# Patient Record
Sex: Female | Born: 1955 | Race: Black or African American | Marital: Married | Smoking: Never smoker
Health system: Southern US, Community
[De-identification: ages and names within clinical notes are randomized; demographics above are authoritative.]

## PROBLEM LIST (undated history)

## (undated) DIAGNOSIS — M545 Low back pain, unspecified: Secondary | ICD-10-CM

## (undated) DIAGNOSIS — M542 Cervicalgia: Secondary | ICD-10-CM

## (undated) HISTORY — DX: Low back pain, unspecified: M54.50

## (undated) HISTORY — DX: Cervicalgia: M54.2

---

## 2004-04-07 ENCOUNTER — Emergency Department
Admit: 2004-04-07 | Discharge: 2004-04-07 | Disposition: A | Discharge status: Home / Self Care | Payer: Self-pay | Source: Emergency Department | Admitting: Internal Medicine

## 2011-08-30 ENCOUNTER — Ambulatory Visit
Admission: RE | Admit: 2011-08-30 | Disposition: A | Discharge status: Home / Self Care | Payer: Self-pay | Source: Ambulatory Visit | Attending: Gastroenterology | Admitting: Gastroenterology

## 2011-08-30 ENCOUNTER — Ambulatory Visit: Payer: Self-pay
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
## 2011-09-24 ENCOUNTER — Inpatient Hospital Stay
Admission: RE | Admit: 2011-09-24 | Disposition: A | Discharge status: Home / Self Care | Payer: Self-pay | Source: Ambulatory Visit | Attending: Surgery | Admitting: Surgery

## 2011-09-24 LAB — TYPE AND SCREEN
AB Screen Gel: NEGATIVE
ABO Rh: O POS

## 2011-09-28 ENCOUNTER — Observation Stay
Admission: EM | Admit: 2011-09-28 | Disposition: A | Discharge status: Home / Self Care | Payer: Self-pay | Source: Emergency Department | Admitting: Internal Medicine

## 2011-09-28 LAB — CBC AND DIFFERENTIAL
Basophils Absolute Automated: 0.02 10*3/uL (ref 0.00–0.20)
Basophils Automated: 0 % (ref 0–2)
Eosinophils Absolute Automated: 0.17 10*3/uL (ref 0.00–0.70)
Eosinophils Automated: 2 % (ref 0–5)
Hematocrit: 34.3 % — ABNORMAL LOW (ref 37.0–47.0)
Hgb: 11.5 g/dL — ABNORMAL LOW (ref 12.0–16.0)
Immature Granulocytes Absolute: 0.05 10*3/uL
Immature Granulocytes: 0 % (ref 0–1)
Lymphocytes Absolute Automated: 4.86 10*3/uL — ABNORMAL HIGH (ref 0.50–4.40)
Lymphocytes Automated: 42 % — ABNORMAL HIGH (ref 15–41)
MCH: 28.9 pg (ref 28.0–32.0)
MCHC: 33.5 g/dL (ref 32.0–36.0)
MCV: 86.2 fL (ref 80.0–100.0)
MPV: 9.2 fL — ABNORMAL LOW (ref 9.4–12.3)
Monocytes Absolute Automated: 1.04 10*3/uL (ref 0.00–1.20)
Monocytes: 9 % (ref 0–11)
Neutrophils Absolute: 5.45 10*3/uL (ref 1.80–8.10)
Neutrophils: 47 % — ABNORMAL LOW (ref 52–75)
Nucleated RBC: 0 /100 WBC
Platelets: 301 10*3/uL (ref 140–400)
RBC: 3.98 10*6/uL — ABNORMAL LOW (ref 4.20–5.40)
RDW: 14 % (ref 12–15)
WBC: 11.54 10*3/uL — ABNORMAL HIGH (ref 3.50–10.80)

## 2011-09-28 LAB — HEPATIC FUNCTION PANEL
ALT: 40 U/L (ref 7–56)
AST (SGOT): 34 U/L (ref 5–40)
Albumin/Globulin Ratio: 1.2 (ref 1.1–1.8)
Albumin: 3.8 g/dL (ref 3.7–5.1)
Alkaline Phosphatase: 73 U/L (ref 43–122)
Bilirubin Direct: 0.4 mg/dL — ABNORMAL HIGH (ref 0.0–0.3)
Bilirubin Indirect: 0.2 mg/dL (ref 0.0–1.1)
Bilirubin, Total: 0.5 mg/dL (ref 0.2–1.3)
Globulin: 3.2 g/dL (ref 2.0–3.7)
Protein, Total: 7 g/dL (ref 6.0–8.0)

## 2011-09-28 LAB — URINALYSIS, REFLEX TO MICROSCOPIC EXAM IF INDICATED
Bilirubin, UA: NEGATIVE
Blood, UA: NEGATIVE
Glucose, UA: NEGATIVE
Ketones UA: 40 — AB
Nitrite, UA: NEGATIVE
Specific Gravity UA POCT: 1.024 (ref 1.001–1.035)
Urine pH: 5 (ref 5.0–8.0)
Urobilinogen, UA: NORMAL mg/dL

## 2011-09-28 LAB — BASIC METABOLIC PANEL
Anion Gap: 15 (ref 5.0–15.0)
BUN: 35 mg/dL — ABNORMAL HIGH (ref 7–21)
CO2: 25 mEq/L (ref 22–31)
Calcium: 9.3 mg/dL (ref 8.6–10.2)
Chloride: 99 mEq/L (ref 98–107)
Creatinine: 1 mg/dL (ref 0.5–1.4)
Glucose: 90 mg/dL (ref 70–100)
Potassium: 3.8 mEq/L (ref 3.6–5.0)
Sodium: 139 mEq/L (ref 136–143)

## 2011-09-28 LAB — LIPASE: Lipase: 110 U/L (ref 23–300)

## 2011-09-28 LAB — GFR: EGFR: >60

## 2011-09-28 LAB — CK: Creatine Kinase (CK): 69 U/L (ref 0–140)

## 2011-09-28 LAB — TROPONIN I: Troponin I: 0.04 ng/mL (ref 0.00–0.11)

## 2011-09-29 LAB — ECG 12-LEAD
Atrial Rate: 102 {beats}/min
P Axis: 83 degrees
P-R Interval: 140 ms
Q-T Interval: 404 ms
QRS Duration: 84 ms
QTC Calculation (Bezet): 526 ms
R Axis: 44 degrees
T Axis: 46 degrees
Ventricular Rate: 102 {beats}/min

## 2011-09-29 NOTE — H&P (Signed)
Paula Trujillo, Paula Trujillo                                              MRN:          [id number]                                                          Account:      [id number]                                         Document ID:  [id number]                                                                                                                                        Admit Date: 09/28/2011     Patient Location: K420-01  Patient Type: I     ATTENDING PHYSICIAN: Carroll Sage, MD        CHIEF COMPLAINT:  Dizziness, generalized weakness.     HISTORY OF PRESENT ILLNESS:  A 55 year old female with obesity, had a gastric bypass surgery done just 4  days ago, discharged home on Thursday, felt very dizzy, lightheaded,  generalized weakness since yesterday.  Admitted in the hospital in [facility] last night with acute dehydration, dizziness and with  orthostatic hypotension.     REVIEW OF SYSTEMS:  No fever, no chills, no chest pain, no shortness of breath, no  palpitations, no nausea, no vomiting, no abdominal pain with status post  gastric bypass surgery.  Dizziness present.  Generalized weakness present.     PAST MEDICAL HISTORY:  History of asthma.     PAST SURGICAL HISTORY:  C-section, gastric bypass, hysterectomy.     ALLERGIES:  No allergies.     MEDICATIONS:  Daily multivitamins, calcium, vitamin B12, iron tablets.     FAMILY HISTORY:  Not significant.     SOCIAL HISTORY:  Lives with the family.     PHYSICAL EXAMINATION:  GENERAL:  The patient is comfortable in the bed.  VITAL SIGNS:  Blood pressure is 123/58, pulse is 95, respiratory rate 18,  temperature 96.8.  Page 1 of 2  Paula Trujillo, Paula Trujillo                                              MRN:          [id number]                                                          Account:      [id number]                                         Document ID:   [id number]                                                                                                                                        GENERAL:  Not in acute distress.  HEENT:  PERRLA. Extraocular movements normal.  NECK:  Supple.  CHEST:  Clear.  CARDIAC:  S1, S2 present.  ABDOMEN:  Dressing present.  EXTREMITIES:  No pedal edema.  NEUROLOGIC:  Alert, awake, oriented.  Motor strength intact.  SKIN:  No bruises.  HEMATOLOGIC:  No bleeding.  PSYCHIATRIC:  Mood stable.     LABORATORY AND DIAGNOSTIC DATA:  WBC 11.54, hemoglobin is 11.5, hematocrit is 34.3, platelets are 301.  Sodium is 139, potassium is 3.8, chloride is 99, bicarbonate is 25, BUN is  35, creatinine is 1, glucose is 90, calcium is 9.3, total protein is 7,  albumin is 3.8, globulin is 3.2, total bilirubin is 0.5, AST is 34, ALT is  40, alkaline phosphatase is 73.  CK is 69.  Lipase is 110.  Troponin is  0.04.  UA:  Leukocyte esterase is moderate.  CT of the chest is normal  postoperative appearance following a recent gastric bypass surgery, no  evidence of pulmonary embolism.  CT abdomen and pelvis:  Normal  postoperative appearance following recent gastric bypass surgery, no CT  evidence of detectable pulmonary embolism.     ASSESSMENT AND PLAN:  1.  Acute dehydration.  2.  Orthostatic hypotension.  3.  Dizziness.     PLAN:  Admitted in the telemetry.  IV fluids, normal saline at 75 mL per hour,  will increase to 125 cc per hour.  We will monitor and if no orthostatic  hypertension, will plan to discharge home today, and the patient needs to  follow up with the gastric bypass surgeon as an outpatient.           Electronic  Signing Provider     D:  09/29/2011 11:25 AM by Dr. Marla Roe. Erlene Senters, MD ([id number])  T:  09/29/2011 13:12 PM by [id number]        cc:                                                                                                           Page 2 of 2  Authenticated by Celso Sickle, MD ([id number]) On  09/29/2011 01:28:45 PM

## 2015-12-08 ENCOUNTER — Other Ambulatory Visit: Payer: Self-pay

## 2016-02-14 ENCOUNTER — Ambulatory Visit: Payer: Self-pay

## 2016-02-16 ENCOUNTER — Encounter (INDEPENDENT_AMBULATORY_CARE_PROVIDER_SITE_OTHER): Payer: Self-pay | Admitting: Neurological Surgery

## 2016-02-23 NOTE — Progress Notes (Signed)
02/14/16 0900   Pain History   Pain Symptoms Pain  (Neck/LBP)   Foot Drop No  (Limping on right)   Incontinence No   Treatments   Have you visited a chiropractor for this problem? No   Have you ever had physical therapy for this problem? No   Have you ever received an injection for this problem? Yes   If so, when? Within the last year   List MD who gave injection: (Dr. Lillia Abed, [facility])   Diagnostic Tests   MRI Cervical;Thoracic;Lumbar   Care Management   How did you hear about our program Internet Search   Have you had any prior spine surgeries? No   ISI Appointment   ISI Physician Rosemarie Beath   ISI Appointment Date 03/05/16  (9:00 AM)   ISI Appointment Location [facility]

## 2016-03-05 ENCOUNTER — Encounter (INDEPENDENT_AMBULATORY_CARE_PROVIDER_SITE_OTHER): Payer: Self-pay | Admitting: Neurological Surgery

## 2016-03-05 ENCOUNTER — Ambulatory Visit (INDEPENDENT_AMBULATORY_CARE_PROVIDER_SITE_OTHER): Payer: BLUE CROSS/BLUE SHIELD | Admitting: Neurological Surgery

## 2016-03-05 ENCOUNTER — Other Ambulatory Visit: Payer: Self-pay

## 2016-03-05 ENCOUNTER — Inpatient Hospital Stay
Admission: RE | Admit: 2016-03-05 | Discharge: 2016-03-05 | Disposition: A | Discharge status: Home / Self Care | Payer: Self-pay | Source: Ambulatory Visit

## 2016-03-05 VITALS — BP 142/97 | HR 97 | Resp 14 | Ht 67.0 in | Wt 139.0 lb

## 2016-03-05 DIAGNOSIS — M5416 Radiculopathy, lumbar region: Secondary | ICD-10-CM

## 2016-03-05 DIAGNOSIS — M5136 Other intervertebral disc degeneration, lumbar region: Secondary | ICD-10-CM

## 2016-03-05 DIAGNOSIS — M25551 Pain in right hip: Secondary | ICD-10-CM | POA: Insufficient documentation

## 2016-03-05 DIAGNOSIS — M5440 Lumbago with sciatica, unspecified side: Secondary | ICD-10-CM

## 2016-03-05 DIAGNOSIS — G8929 Other chronic pain: Secondary | ICD-10-CM

## 2016-03-05 NOTE — Progress Notes (Signed)
Review of Systems   Constitutional: Positive for diaphoresis, activity change and fatigue.   HENT: Positive for dental problem.    Eyes: Negative.    Respiratory: Positive for shortness of breath and wheezing.    Cardiovascular: Negative.    Gastrointestinal: Positive for nausea and abdominal pain.   Endocrine: Negative.    Genitourinary: Positive for flank pain.   Musculoskeletal: Positive for back pain, gait problem, neck pain and neck stiffness.   Skin: Negative.    Allergic/Immunologic: Negative.    Neurological: Positive for dizziness, tremors, weakness and numbness.   Hematological: Bruises/bleeds easily.   Psychiatric/Behavioral: Positive for decreased concentration and agitation.

## 2016-03-05 NOTE — Progress Notes (Addendum)
[facility]  New Patient Note    Referring MD:    Primary Care MD: No primary care provider on file.    MRN: [id number]    HPI     Chief Complaint   Patient presents with   . Initial Consult     HPI    Patient ID: Paula Trujillo is a 60 y.o.  female, DOB 31-Dec-1955 is here today for evaluation of RLE pain. Patient has h/o scoliosis and reports chronic back pain, however, she has had worsening of sx since February 2017.  She was evaluated in Feb 2017 at ED where she had L-spine MRI done and was told that the pain was coming from her spine and not her R hip. She has not had formal evaluation with ortho for hip. Pain is through R hip and radiates to groin, anterior thigh, lateral leg. She does not think the pain goes to the top of foot. There is associated numbness and tingling. She notes constant pain but she also gets "spikes" that escalate her pain level to 8/10. There are no specific inciting factors as to when she gets these "spikes". She states that the pain is debilitating and "pasralyzes her R leg" when it happens. She is concerned about falling and embarrassed by these episodes. She had 1 session of PT but did not return because she did not like the provider. She has had ESI x 4 (3 lower back and one upper lumbar) by [facility]. She notes most improvement with the upper spine injection. She is anxious for a solution as she states it is affecting her QOL. There is no BB dysfunction.     Medical History     Past Medical History   Diagnosis Date   . Lower back pain    . Neck pain        Surgical History   No past surgical history on file.    Family History   No family history on file.     Social History     Social History     Social History   . Marital Status: Married     Spouse Name: N/A   . Number of Children: N/A   . Years of Education: N/A     Occupational History   . Not on file.     Social History Main Topics   . Smoking status: Never Smoker    . Smokeless tobacco: Not on file   .  Alcohol Use: No   . Drug Use: No   . Sexual Activity: Not on file     Other Topics Concern   . Not on file     Social History Narrative   [employer]: Field seismologist  Current Medications     Current Outpatient Prescriptions   Medication Sig Dispense Refill   . albuterol (PROVENTIL HFA;VENTOLIN HFA) 108 (90 Base) MCG/ACT inhaler Inhale 2 puffs into the lungs.     . Estrogens Conjugated (PREMARIN PO) Take by mouth.     . Olmesartan Medoxomil (BENICAR PO) Take by mouth.     . OXYCODONE HCL PO Take by mouth.       No current facility-administered medications for this visit.       Allergies   No Known Allergies    Review of Systems   Review of Systems  Constitutional: Positive for diaphoresis, activity change and fatigue.   HENT: Positive for dental problem.    Eyes: Negative.  Respiratory: Positive for shortness of breath and wheezing.    Cardiovascular: Negative.    Gastrointestinal: Positive for nausea and abdominal pain.   Endocrine: Negative.    Genitourinary: Positive for flank pain.   Musculoskeletal: Positive for back pain, gait problem, neck pain and neck stiffness.   Skin: Negative.    Allergic/Immunologic: Negative.    Neurological: Positive for dizziness, tremors, weakness and numbness.   Hematological: Bruises/bleeds easily.   Psychiatric/Behavioral: Positive for decreased concentration and agitation.      Physical Examination   VITAL SIGNS:   height is 1.702 m (5\' 7" ) and weight is 63.05 kg (139 lb). Her blood pressure is 142/97 and her pulse is 97. Her respiration is 14.          General:  Well developed, well nourished, no apparent distress  Neck:  Supple, no JVD, no apparent lymphadenopathy  HEENT:  Head normocephalic, atraumatic, no discharge from ears or nose  Chest:  Equal chest rise.  No wheezes, rales or rhonchi.  Skin:  No obvious lesions or scars  Extremities:  Without clubbing or cyanosis    Neurologic Exam     Mental Status   Oriented to person, place, and time.   Speech: speech is normal      Level of consciousness: alert    Cranial Nerves     CN II   Visual fields full to confrontation.     CN III, IV, VI   Pupils are equal, round, and reactive to light.  Extraocular motions are normal.     CN V   Facial sensation intact.     CN VII   Facial expression full, symmetric.     CN VIII   CN VIII normal.     CN IX, X   CN IX normal.     CN XI   CN XI normal.     CN XII   CN XII normal.     Motor Exam   Muscle bulk: normal  Overall muscle tone: normal    Strength   Strength 5/5 except as noted.   Right strength: R IP 4+/5  Right neck flexion: 5/5  Left neck flexion: 5/5  Right neck extension: 5/5  Left neck extension: 5/5  Right deltoid: 5/5  Left deltoid: 5/5  Right biceps: 5/5  Left biceps: 5/5  Right triceps: 5/5  Left triceps: 5/5  Right wrist flexion: 5/5  Left wrist flexion: 5/5  Right wrist extension: 5/5  Left wrist extension: 5/5  Right interossei: 5/5  Left interossei: 5/5  Right abdominals: 5/5  Left abdominals: 5/5  Right iliopsoas: 5/5  Left iliopsoas: 5/5  Right quadriceps: 5/5  Left quadriceps: 5/5  Right hamstring: 5/5  Left hamstring: 5/5  Right glutei: 5/5  Left glutei: 5/5  Right anterior tibial: 5/5  Left anterior tibial: 5/5  Right posterior tibial: 5/5  Left posterior tibial: 5/5  Right peroneal: 5/5  Left peroneal: 5/5  Right gastroc: 5/5  Left gastroc: 5/5       -SLR  ADD/Abd 5/5  No pain with PROM R hip     Sensory Exam   Light touch normal.   Pinprick normal.     Gait, Coordination, and Reflexes     Gait  Gait: normal    Reflexes   Right brachioradialis: 2+  Left brachioradialis: 2+  Right biceps: 2+  Left biceps: 2+  Right patellar: 2+  Left patellar: 2+  Right achilles: 2+  Left achilles: 2+  Radiology Interpretation   Mri Lumbar Spine 12/08/15; comparison to 01/31/2009    L1-2: small disc bulge with no stenosis or root encroachment. There is disc space narrowing and dessication  L2-3: No significant stenosis or root encroachment.  L3-4: No significant stenosis or root  encroachment.  L4-5: disc dessication; previously seen disc protrusion and stenosis are less than on prior exam. Lateral disc protrusions may be touching exiting L4 nerve roots  L5-S1: There is conjoined root at the S1 level on the left. No significant disc herniation or stenosis    Impression   33 y F, Lumbar  DDD, levoscoliosis. R hip and leg pain that seem to favor L5 distribution [secondary to L4/5 disc protrusion] as opposed to L1-2 however no significant nerve compression on MRI and overall MRI appears improved from prior study. We need to rule out R hip pathology and will refer to Dr. Lewie Chamber for evaluation. We will also refer to Dr. Roselyn Meier for L1-2 R facet injection and advised patient to keep diary of post-procedure pain levels. We will also send for PT and patient was encouraged to do the recommended sessions. We will see her back in 4 weeks to monitor progress.   If pain continues to persist at folow-up, we will likely proceed with discogram.   Plan     Orders Placed This Encounter   Procedures   . Referral to Physical Therapy     Referral Priority:  Routine     Referral Type:  Physical Medicine     Referral Reason:  Specialty Services Required     Requested Specialty:  Physical Therapy     Number of Visits Requested:  1   . Ambulatory referral to Orthopedic Surgery     Referral Priority:  Routine     Referral Type:  Surgical     Referral Reason:  Specialty Services Required     Requested Specialty:  Orthopaedic Surgery     Number of Visits Requested:  1   . Referral to Pain Clinic     Referral Priority:  Routine     Referral Type:  Consultation     Referral Reason:  Specialty Services Required     Requested Specialty:  Pain Medicine     Number of Visits Requested:  1   RX: TENS unit  Follow-up   4 weeks    Joy A. Rostron, PA-C   Marlaine Hind, MD

## 2016-03-06 ENCOUNTER — Ambulatory Visit: Payer: Self-pay

## 2016-03-06 DIAGNOSIS — G8929 Other chronic pain: Secondary | ICD-10-CM | POA: Insufficient documentation

## 2016-03-15 ENCOUNTER — Encounter (INDEPENDENT_AMBULATORY_CARE_PROVIDER_SITE_OTHER): Payer: Self-pay | Admitting: Orthopaedic Surgery

## 2016-03-15 ENCOUNTER — Ambulatory Visit (INDEPENDENT_AMBULATORY_CARE_PROVIDER_SITE_OTHER): Payer: BLUE CROSS/BLUE SHIELD

## 2016-03-15 ENCOUNTER — Ambulatory Visit (INDEPENDENT_AMBULATORY_CARE_PROVIDER_SITE_OTHER): Payer: BLUE CROSS/BLUE SHIELD | Admitting: Orthopaedic Surgery

## 2016-03-15 VITALS — BP 120/75 | HR 83 | Temp 98.2°F | Ht 66.38 in | Wt 139.0 lb

## 2016-03-15 DIAGNOSIS — M7061 Trochanteric bursitis, right hip: Secondary | ICD-10-CM

## 2016-03-15 DIAGNOSIS — M5416 Radiculopathy, lumbar region: Secondary | ICD-10-CM

## 2016-03-15 DIAGNOSIS — M5136 Other intervertebral disc degeneration, lumbar region: Secondary | ICD-10-CM

## 2016-03-15 DIAGNOSIS — M25551 Pain in right hip: Secondary | ICD-10-CM

## 2016-03-15 MED ORDER — TRIAMCINOLONE ACETONIDE 40 MG/ML IJ SUSP
40.0000 mg | Freq: Once | INTRAMUSCULAR | Status: AC
Start: 2016-03-15 — End: 2016-03-15
  Administered 2016-03-15: 40 mg via INTRA_ARTICULAR

## 2016-03-15 NOTE — Patient Instructions (Signed)
Ice for hip pain or swelling  OTCs for pain  PT as directed.   Hip exercises daily  Low impact activities only  Ideal body weight  F/U PRN

## 2016-03-15 NOTE — Progress Notes (Signed)
Paula Trujillo is a 60 y.o. female with the following history as recorded in EpicCare:  Patient Active Problem List    Diagnosis Date Noted   . Trochanteric bursitis, right hip 03/15/2016   . Lumbar radiculopathy 03/06/2016   . Chronic midline low back pain with sciatica 03/06/2016   . DDD (degenerative disc disease), lumbar 03/05/2016   . Right hip pain 03/05/2016     Current Outpatient Prescriptions   Medication Sig Dispense Refill   . albuterol (PROVENTIL HFA;VENTOLIN HFA) 108 (90 Base) MCG/ACT inhaler Inhale 2 puffs into the lungs.     Marland Kitchen albuterol-ipratropium (COMBIVENT RESPIMAT) 20-100 MCG/ACT Aero Soln      . celecoxib (CELEBREX) 200 MG capsule      . Estrogens Conjugated (PREMARIN PO) Take by mouth.     . olmesartan-hydrochlorothiazide (BENICAR HCT) 20-12.5 MG per tablet      . oxyCODONE 10 MG Tab        No current facility-administered medications for this visit.     Allergies: Review of patient's allergies indicates no known allergies.  Past Medical History   Diagnosis Date   . Lower back pain    . Neck pain      History reviewed. No pertinent past surgical history.  History reviewed. No pertinent family history.  Social History   Substance Use Topics   . Smoking status: Never Smoker    . Smokeless tobacco: Not on file   . 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   There has been no injury.  It started 3 to 4 days after moving some heavy  rolls of paper at work, but not any pain at the moment of moving the paper.   She went to the [facility] hospital ED where x-rays were taken.  It was thought  possibly to be a lower back source to the pain.  She was placed on  oxycodone, which has not helped.  The right hip pain is lateral  in  location, worse with walking and stairs, when lying on her right side.  It  comes and goes.  It extends to the knee.  She gets an "electricity  sensation" at times.  She has been able to work.  She is on no medication  currently for symptoms.     General health includes history of gastric bypass surgery 5 years ago so  she avoids NSAIDs.  She has had 3 nerve blocks for her chronic lower back  pain problem, and currently, Paula Beath, MD, her spine specialist, has  referred to physical therapy for lumbar disk disease.     GENERAL HEALTH, MEDICATIONS, ALLERGIES:  Otherwise noted on the Epic record above.     SOCIAL HISTORY AND FAMILY HISTORY:  Noted on the Epic record above.     REVIEW OF SYSTEMS:  Noted on the Epic record above as outlined in the HPI.  All other systems  reviewed are negative.     PHYSICAL EXAMINATION:  Reveals a healthy-appearing patient in no distress.  She walks without a  limp.  She can  heel-toe walk with satisfactory strength.  Lumbosacral spine  is stiff but not tender.  There is no referred pain on motion of the spine.   She has satisfactory motion in the hips without pain.  She has tenderness  over the right greater trochanter.  No tenderness elsewhere about the  pelvis or hips.  Straight leg raising is negative bilaterally.  Lower  extremity neurovascular status is intact.  There is no swelling in the  legs, no skin rashes or lesions in the legs.     MRI report from [facility] dated December 11, 2015, lumbosacral spine shows a  small disk herniation on the right side at L4-L5.  Some degenerative  changes are noted.     X-rays of the right hip today suggests some slight joint space narrowing  but no definitive signs of osteoarthritis.     IMPRESSION:  1.  Pain, right hip.  2.  Trochanteric bursitis, right hip.  3.  History of lumbar disk disease.     Treatment options for the hip bursitis were discussed.  It was elected to  proceed with a cortisone injection to the right hip greater  trochanteric  bursa done today with 1 mL of Kenalog 40 mg and 3 mL of 0.5% Marcaine  injected following Betadine and alcohol prep with no adverse reaction.  The  patient will ice the hip today.  She will begin a physical therapy program  for her hip.  She is advised ice and OTCs anytime there is soreness or  swelling.  She will work her hip exercises daily, low-impact activities,  ideal body weight, and follow up p.r.n.

## 2016-04-04 ENCOUNTER — Encounter (INDEPENDENT_AMBULATORY_CARE_PROVIDER_SITE_OTHER): Payer: Self-pay | Admitting: Neurological Surgery

## 2016-04-04 ENCOUNTER — Inpatient Hospital Stay: Payer: BLUE CROSS/BLUE SHIELD | Admitting: Hospitalist

## 2016-04-04 ENCOUNTER — Encounter (INDEPENDENT_AMBULATORY_CARE_PROVIDER_SITE_OTHER): Payer: Self-pay | Admitting: Orthopaedic Surgery

## 2016-04-04 ENCOUNTER — Emergency Department: Payer: BLUE CROSS/BLUE SHIELD

## 2016-04-04 ENCOUNTER — Inpatient Hospital Stay
Admission: EM | Admit: 2016-04-04 | Discharge: 2016-04-10 | DRG: 552 | Disposition: A | Discharge status: Home / Self Care | Payer: BLUE CROSS/BLUE SHIELD | Attending: Hospitalist | Admitting: Hospitalist

## 2016-04-04 ENCOUNTER — Ambulatory Visit (INDEPENDENT_AMBULATORY_CARE_PROVIDER_SITE_OTHER): Payer: BLUE CROSS/BLUE SHIELD | Admitting: Orthopaedic Surgery

## 2016-04-04 VITALS — BP 122/74 | HR 85 | Ht 66.38 in | Wt 139.0 lb

## 2016-04-04 DIAGNOSIS — M6281 Muscle weakness (generalized): Secondary | ICD-10-CM

## 2016-04-04 DIAGNOSIS — M5126 Other intervertebral disc displacement, lumbar region: Secondary | ICD-10-CM

## 2016-04-04 DIAGNOSIS — M5117 Intervertebral disc disorders with radiculopathy, lumbosacral region: Principal | ICD-10-CM | POA: Diagnosis present

## 2016-04-04 DIAGNOSIS — R29898 Other symptoms and signs involving the musculoskeletal system: Secondary | ICD-10-CM

## 2016-04-04 DIAGNOSIS — I10 Essential (primary) hypertension: Secondary | ICD-10-CM | POA: Diagnosis present

## 2016-04-04 DIAGNOSIS — M5416 Radiculopathy, lumbar region: Secondary | ICD-10-CM

## 2016-04-04 DIAGNOSIS — M25551 Pain in right hip: Secondary | ICD-10-CM | POA: Diagnosis present

## 2016-04-04 DIAGNOSIS — R52 Pain, unspecified: Secondary | ICD-10-CM | POA: Diagnosis present

## 2016-04-04 DIAGNOSIS — M5136 Other intervertebral disc degeneration, lumbar region: Secondary | ICD-10-CM

## 2016-04-04 DIAGNOSIS — D649 Anemia, unspecified: Secondary | ICD-10-CM | POA: Diagnosis present

## 2016-04-04 DIAGNOSIS — G8929 Other chronic pain: Secondary | ICD-10-CM

## 2016-04-04 DIAGNOSIS — M7061 Trochanteric bursitis, right hip: Secondary | ICD-10-CM

## 2016-04-04 DIAGNOSIS — Z90710 Acquired absence of both cervix and uterus: Secondary | ICD-10-CM

## 2016-04-04 DIAGNOSIS — M5441 Lumbago with sciatica, right side: Secondary | ICD-10-CM

## 2016-04-04 DIAGNOSIS — Z9884 Bariatric surgery status: Secondary | ICD-10-CM

## 2016-04-04 LAB — CBC AND DIFFERENTIAL
Absolute NRBC: 0 10*3/uL
Basophils Absolute Automated: 0.01 10*3/uL (ref 0.00–0.20)
Basophils Automated: 0.1 %
Eosinophils Absolute Automated: 0.05 10*3/uL (ref 0.00–0.70)
Eosinophils Automated: 0.7 %
Hematocrit: 30.6 % — ABNORMAL LOW (ref 37.0–47.0)
Hgb: 9.8 g/dL — ABNORMAL LOW (ref 12.0–16.0)
Immature Granulocytes Absolute: 0.02 10*3/uL
Immature Granulocytes: 0.3 %
Lymphocytes Absolute Automated: 2.08 10*3/uL (ref 0.50–4.40)
Lymphocytes Automated: 31 %
MCH: 26.1 pg — ABNORMAL LOW (ref 28.0–32.0)
MCHC: 32 g/dL (ref 32.0–36.0)
MCV: 81.6 fL (ref 80.0–100.0)
MPV: 9.6 fL (ref 9.4–12.3)
Monocytes Absolute Automated: 0.44 10*3/uL (ref 0.00–1.20)
Monocytes: 6.6 %
Neutrophils Absolute: 4.11 10*3/uL (ref 1.80–8.10)
Neutrophils: 61.3 %
Nucleated RBC: 0 /100 WBC (ref 0.0–1.0)
Platelets: 303 10*3/uL (ref 140–400)
RBC: 3.75 10*6/uL — ABNORMAL LOW (ref 4.20–5.40)
RDW: 15 % (ref 12–15)
WBC: 6.71 10*3/uL (ref 3.50–10.80)

## 2016-04-04 LAB — BASIC METABOLIC PANEL
BUN: 19 mg/dL (ref 7.0–19.0)
CO2: 25 mEq/L (ref 22–29)
Calcium: 8.9 mg/dL (ref 8.5–10.5)
Chloride: 107 mEq/L (ref 100–111)
Creatinine: 0.8 mg/dL (ref 0.6–1.0)
Glucose: 107 mg/dL — ABNORMAL HIGH (ref 70–100)
Potassium: 4.1 mEq/L (ref 3.5–5.1)
Sodium: 140 mEq/L (ref 136–145)

## 2016-04-04 LAB — GFR: EGFR: >60

## 2016-04-04 LAB — C-REACTIVE PROTEIN: C-Reactive Protein: <0.1 mg/dL (ref 0.0–0.8)

## 2016-04-04 LAB — SEDIMENTATION RATE: Sed Rate: 20 mm/Hr (ref 0–20)

## 2016-04-04 MED ORDER — ENOXAPARIN SODIUM 40 MG/0.4ML SC SOLN
40.0000 mg | Freq: Every evening | SUBCUTANEOUS | Status: DC
Start: 2016-04-05 — End: 2016-04-10
  Administered 2016-04-05 – 2016-04-09 (×4): 40 mg via SUBCUTANEOUS
  Filled 2016-04-04 (×4): qty 0.4

## 2016-04-04 MED ORDER — ONDANSETRON 4 MG PO TBDP
4.0000 mg | ORAL_TABLET | Freq: Four times a day (QID) | ORAL | Status: DC | PRN
Start: 2016-04-04 — End: 2016-04-10

## 2016-04-04 MED ORDER — METHYLPREDNISOLONE 4 MG PO TBPK
ORAL_TABLET | ORAL | Status: DC
Start: 2016-04-04 — End: 2016-04-10

## 2016-04-04 MED ORDER — HYDROCODONE-ACETAMINOPHEN 5-325 MG PO TABS
2.0000 | ORAL_TABLET | Freq: Once | ORAL | Status: DC
Start: 2016-04-04 — End: 2016-04-04

## 2016-04-04 MED ORDER — OXYCODONE HCL 5 MG PO TABS
5.0000 mg | ORAL_TABLET | ORAL | Status: DC | PRN
Start: 2016-04-04 — End: 2016-04-05

## 2016-04-04 MED ORDER — HYDROMORPHONE HCL 1 MG/ML IJ SOLN
1.0000 mg | Freq: Once | INTRAMUSCULAR | Status: AC
Start: 2016-04-04 — End: 2016-04-04
  Administered 2016-04-04: 1 mg via INTRAVENOUS
  Filled 2016-04-04: qty 1

## 2016-04-04 MED ORDER — GABAPENTIN 100 MG PO CAPS
200.0000 mg | ORAL_CAPSULE | Freq: Three times a day (TID) | ORAL | Status: DC
Start: 2016-04-05 — End: 2016-04-05
  Administered 2016-04-05 (×2): 200 mg via ORAL
  Filled 2016-04-04 (×2): qty 2

## 2016-04-04 MED ORDER — ACETAMINOPHEN 325 MG PO TABS
650.0000 mg | ORAL_TABLET | ORAL | Status: DC | PRN
Start: 2016-04-04 — End: 2016-04-09

## 2016-04-04 MED ORDER — METHYLPREDNISOLONE 4 MG PO TBPK
ORAL_TABLET | ORAL | Status: DC
Start: 2016-04-04 — End: 2016-04-04

## 2016-04-04 MED ORDER — HYDROMORPHONE HCL 1 MG/ML IJ SOLN
0.5000 mg | Freq: Once | INTRAMUSCULAR | Status: AC
Start: 2016-04-04 — End: 2016-04-04
  Administered 2016-04-04: 0.5 mg via INTRAVENOUS
  Filled 2016-04-04: qty 1

## 2016-04-04 MED ORDER — ONDANSETRON HCL 4 MG/2ML IJ SOLN
4.0000 mg | Freq: Four times a day (QID) | INTRAMUSCULAR | Status: DC | PRN
Start: 2016-04-04 — End: 2016-04-10

## 2016-04-04 MED ORDER — CALCIUM CARBONATE ANTACID 500 MG PO CHEW
1000.0000 mg | CHEWABLE_TABLET | Freq: Four times a day (QID) | ORAL | Status: DC | PRN
Start: 2016-04-04 — End: 2016-04-10

## 2016-04-04 MED ORDER — ONDANSETRON HCL 4 MG/2ML IJ SOLN
4.0000 mg | Freq: Once | INTRAMUSCULAR | Status: AC
Start: 2016-04-04 — End: 2016-04-04
  Administered 2016-04-04: 4 mg via INTRAVENOUS
  Filled 2016-04-04: qty 2

## 2016-04-04 MED ORDER — DIPHENHYDRAMINE HCL 50 MG/ML IJ SOLN
12.5000 mg | Freq: Four times a day (QID) | INTRAMUSCULAR | Status: DC | PRN
Start: 2016-04-04 — End: 2016-04-10
  Administered 2016-04-05: 12.5 mg via INTRAVENOUS
  Filled 2016-04-04: qty 1

## 2016-04-04 MED ORDER — LORAZEPAM 2 MG/ML IJ SOLN
1.0000 mg | Freq: Once | INTRAMUSCULAR | Status: AC
Start: 2016-04-04 — End: 2016-04-04
  Administered 2016-04-04: 1 mg via INTRAVENOUS
  Filled 2016-04-04: qty 1

## 2016-04-04 MED ORDER — HYDROMORPHONE HCL 1 MG/ML IJ SOLN
1.0000 mg | INTRAMUSCULAR | Status: DC | PRN
Start: 2016-04-04 — End: 2016-04-05
  Administered 2016-04-05: 1 mg via INTRAVENOUS
  Filled 2016-04-04: qty 1

## 2016-04-04 MED ORDER — SENNOSIDES-DOCUSATE SODIUM 8.6-50 MG PO TABS
2.0000 | ORAL_TABLET | Freq: Two times a day (BID) | ORAL | Status: DC | PRN
Start: 2016-04-04 — End: 2016-04-10

## 2016-04-04 MED ORDER — KETOROLAC TROMETHAMINE 30 MG/ML IJ SOLN
30.0000 mg | Freq: Three times a day (TID) | INTRAMUSCULAR | Status: AC | PRN
Start: 2016-04-04 — End: 2016-04-06
  Administered 2016-04-05 – 2016-04-06 (×3): 30 mg via INTRAVENOUS
  Filled 2016-04-04 (×4): qty 1

## 2016-04-04 NOTE — H&P (Addendum)
[facility] HOSPITALIST ADMISSION HISTORY AND PHYSICAL EXAM    Date Time: 04/04/2016 9:53 PM  Patient Name: Paula Trujillo  Attending Physician: Koleen Distance, MD  Primary Care Physician: Patsy Lager, MD    CC: intractable right hip pain      History of Presenting Illness:   Paula Trujillo is a 60 y.o. female hx gastric bypass, chronic pelvic pain s/p hysterectomy, HTN who presents to the hospital with intractable right hip pain. This started suddenly 11/2015. She was seen at the [facility] and had X-rays with concern for lumbar stenosis so went to [facility] (pain management) and received spine injections in the lower and upper lumbar spines which were ineffective. She then went to Dr. Deloria Lair and Dr. Lewie Chamber to look for a cause for her pain. She had bursal right hip injections which didn't help. She tried several analgesics which didn't help. She had MRI lumbar spine and MRI hip which showed minor disease which didn't explain her problems. She notes difficulty walking and sitting because of pain. She saw Dr. Lewie Chamber who noted she had severe pain and was screaming so sent her to the ER. The patient doesn't want analgesics but rather wants to find the cause of her pain. These symptoms are sudden onset, moderate intensity, without alleviating factors.     She mentioned having severe pelvic pain in the distant past and initially being diagnosed with endometriosis so she had hysterectomy. The pain was relieved for 6 months then recurred. She ultimately went to [facility] who felt that she had chronic pelvic pain and referred her to pain management but ultimately she stopped going after 4 years because the medications didn't seem to help.    Past Medical History:     Past Medical History   Diagnosis Date   . Lower back pain    . Neck pain        Past Surgical History:   History reviewed. No pertinent past surgical history.    Family History:   History reviewed. No pertinent family history.   Her sister died of stomach cancer.      Social History:     Social History     Social History   . Marital Status: Married     Spouse Name: N/A   . Number of Children: N/A   . Years of Education: N/A     Social History Main Topics   . Smoking status: Never Smoker    . Smokeless tobacco: Not on file   . Alcohol Use: No   . Drug Use: No   . Sexual Activity: Not on file     Other Topics Concern   . Not on file     Social History Narrative       Allergies:   No Known Allergies    Medications:     (Not in a hospital admission)        Review of Systems:   All other systems were reviewed and are negative except: as above    Physical Exam:     Filed Vitals:    04/04/16 1500   BP: 130/75   Pulse: 96   Temp: 98.2 F (36.8 C)   Resp: 18   SpO2: 100%       Intake and Output Summary (Last 24 hours) at Date Time  No intake or output data in the 24 hours ending 04/04/16 2153    General: awake, alert, oriented x 3; mild distress.  HEENT: perrla, eomi, sclera  anicteric  oropharynx clear without lesions, mucous membranes moist  Neck: supple, no lymphadenopathy, no thyromegaly, no JVD, no carotid bruits  Cardiovascular: regular rate and rhythm, no murmurs, rubs or gallops  Lungs: clear to auscultation bilaterally, without wheezing, rhonchi, or rales  Abdomen: soft, non-tender, non-distended; no palpable masses, no hepatosplenomegaly, normoactive bowel sounds, no rebound or guarding  Extremities: no clubbing, cyanosis, or edema  Neuro: cranial nerves grossly intact, strength 5/5 in upper and lower extremities, sensation intact, follows commands 100% of the time  Skin: no rashes or lesions noted      Labs:     Results     Procedure Component Value Units Date/Time    Sedimentation rate (ESR) [[id number]] Collected:  04/04/16 1616    Specimen Information:  Blood Updated:  04/04/16 1731     Sed Rate 20 mm/Hr     GFR [[id number]] Collected:  04/04/16 1616     EGFR >60.0 Updated:  04/04/16 1639    Basic Metabolic Panel [[id number]]  (Abnormal) Collected:  04/04/16 1616     Specimen Information:  Blood Updated:  04/04/16 1639     Glucose 107 (H) mg/dL      BUN 08.6 mg/dL      Creatinine 0.8 mg/dL      Calcium 8.9 mg/dL      Sodium 578 mEq/L      Potassium 4.1 mEq/L      Chloride 107 mEq/L      CO2 25 mEq/L     C Reactive Protein [[id number]] Collected:  04/04/16 1616    Specimen Information:  Blood Updated:  04/04/16 1639     C-Reactive Protein <0.1 mg/dL     CBC with differential [[id number]]  (Abnormal) Collected:  04/04/16 1616    Specimen Information:  Blood from Blood Updated:  04/04/16 1629     WBC 6.71 x10 3/uL      Hgb 9.8 (L) g/dL      Hematocrit 27.2 (L) %      Platelets 303 x10 3/uL      RBC 3.75 (L) x10 6/uL      MCV 81.6 fL      MCH 26.1 (L) pg      MCHC 32.0 g/dL      RDW 15 %      MPV 9.6 fL      Neutrophils 61.3 %      Lymphocytes Automated 31.0 %      Monocytes 6.6 %      Eosinophils Automated 0.7 %      Basophils Automated 0.1 %      Immature Granulocyte 0.3 %      Nucleated RBC 0.0 /100 WBC      Neutrophils Absolute 4.11 x10 3/uL      Abs Lymph Automated 2.08 x10 3/uL      Abs Mono Automated 0.44 x10 3/uL      Abs Eos Automated 0.05 x10 3/uL      Absolute Baso Automated 0.01 x10 3/uL      Absolute Immature Granulocyte 0.02 x10 3/uL      Absolute NRBC 0.00 x10 3/uL           Radiology Results (24 Hour)     Procedure Component Value Units Date/Time    CT L- Spine without Contrast [[id number]] Collected:  04/04/16 2133    Order Status:  Completed Updated:  04/04/16 2145    Narrative:      HISTORY: Right hip pain.    COMPARISON: None.  TECHNIQUE: Non-contrast CT of  the the lumbar spine was performed.   Reconstructed 2D coronal and sagittal images were obtained with the  data.  The following dose reduction techniques were utilized: automated  exposure control and/or adjustment of the mA and/or kV according to  patient's size, and the use of iterative reconstruction technique.      FINDINGS:       The bones of the lumbar spine are aligned. No fracture is seen.  There  is  no evidence of a pars interarticularis defect in the lumber spine.  There are mild degenerative changes along the disc margin at multiple  levels of the lumbar spine most prominent at L1-L2 and L4-L5.    There are punctate calcifications within the kidneys bilaterally which  may represent nonobstructing nephrolithiasis.        Impression:         1. No lumbar spine fracture is detected.   2. Mild degenerative changes of the lumbar spine most prominent at L1-L2  and L4-L5.    Neldon Mc, MD   04/04/2016 9:41 PM      Hip Right AP and Lateral with Pelvis [[id number]] Collected:  04/04/16 1651    Order Status:  Completed Updated:  04/04/16 1659    Narrative:      History: Right hip pain.       Frontal view of the pelvis and 2 views of the right hip obtained. No  apparent fracture dislocation. No bone destruction.      Impression:        1. No evidence of acute bony process.    Meryle Ready, MD   04/04/2016 4:55 PM              Assessment:     Patient Active Problem List   Diagnosis   . DDD (degenerative disc disease), lumbar   . Right hip pain   . Lumbar radiculopathy   . Chronic midline low back pain with sciatica   . Trochanteric bursitis, right hip   . HNP (herniated nucleus pulposus), lumbar   . Weakness of right leg       60 yo female hx gastric bypass, chronic pelvic pain s/p hysterectomy, HTN who presents with intractable right hip pain.     Plan:   Intractable right hip pain - Unclear etiology. Neurosurgery recommended MRI lumbar spine with contrast. PT/OT. Analgesia. Neurontin. ESR/CRP normal. I suspect she will [facility] but she does not want to return to [place].    Anemia - Follow.    Hx gastric bypass, chronic pelvic pain s/p hysterectomy, HTN - Home meds.    DVT/GI prophylaxis - Lovenox, SCDs.     Status/Rationale: Observation spine ward.     Signed by: Melton Krebs, MD   cc:Pcp, Kathreen Cosier, MD

## 2016-04-04 NOTE — ED Notes (Signed)
MRI check list completed and faxed.

## 2016-04-04 NOTE — ED Notes (Signed)
Pt reports 10/10 pain and states that she has been given many types of pain medications in the past and "nothing helps". Pt states that she does not want anything for pain at this time.

## 2016-04-04 NOTE — Progress Notes (Signed)
Paula Trujillo is a 60 y.o. female with the following history as recorded in Epic[employer]:  Patient Active Problem List    Diagnosis Date Noted   . HNP (herniated nucleus pulposus), lumbar 04/04/2016   . Weakness of right leg 04/04/2016   . Trochanteric bursitis, right hip 03/15/2016   . Lumbar radiculopathy 03/06/2016   . Chronic midline low back pain with sciatica 03/06/2016   . DDD (degenerative disc disease), lumbar 03/05/2016   . Right hip pain 03/05/2016     Current Outpatient Prescriptions   Medication Sig Dispense Refill   . albuterol (PROVENTIL HFA;VENTOLIN HFA) 108 (90 Base) MCG/ACT inhaler Inhale 2 puffs into the lungs.     Marland Kitchen albuterol-ipratropium (COMBIVENT RESPIMAT) 20-100 MCG/ACT Aero Soln      . celecoxib (CELEBREX) 200 MG capsule      . Estrogens Conjugated (PREMARIN PO) Take by mouth.     . methylPREDNISolone (MEDROL DOSPACK) 4 MG tablet follow package directions 21 tablet 0   . olmesartan-hydrochlorothiazide (BENICAR HCT) 20-12.5 MG per tablet      . oxyCODONE 10 MG Tab        No current facility-administered medications for this visit.     Allergies: Review of patient's allergies indicates no known allergies.  Past Medical History   Diagnosis Date   . Lower back pain    . Neck pain      History reviewed. No pertinent past surgical history.  History reviewed. No pertinent family history.  Social History   Substance Use Topics   . Smoking status: Never Smoker    . Smokeless tobacco: Not on file   . Alcohol Use: No     Filed Vitals:    04/04/16 1257   BP: 122/74   Pulse: 85   Height: 1.686 m (5' 6.38")   Weight: 63.05 kg (139 lb)   The patient presents today with her husband for evaluation of right hip,  lower back and leg pain.  Since her last visit here the pain has been  getting worse.  She describes it as stabbing pain on the lateral side of  the right hip extending down the leg.  The cortisone injections in the  trochanteric bursitis last time provided no relief.  She has pain in the  right lower  back, right hip, and numbness in the right leg with occasional  pain into the left calf.  She is taking gabapentin and Nucynta for pain  relief.  She had several epidural blocks with no relief done through the  [facility].  She recently had a Medrol Dosepak with some Valium  with no relief.     GENERAL HEALTH, MEDICATIONS, ALLERGIES:  Otherwise noted on the Epic record above.     PHYSICAL EXAMINATION:  Reveals a healthy patient, repeatedly shifting her position even while sitting,  seemingly unable to find a comfortable position.  She walks with a  very minimally stiff gait on the right.  She can heel-toe walk with good  strength.  Lumbosacral spine is not tender.  There is mild stiffness in  supine, no referred pain on motion of the spine.  There is no tenderness  about the hips on today's exam.  There is satisfactory motion in the hips  without pain.  Straight leg raising is negative bilaterally, but neurologic  exam today reveals weakness in the right extensor hallucis longus.     The patient's previous MRI noted on the previous note also included  documentation of  a right-sided herniated disk at L4-L5.     DIAGNOSTIC:    MRI of the right hip from [place] station number [id number] from March 28, 2016,  shows moderate degree of osteoarthritis in the right hip with some changes  suggesting FIA as well.     IMPRESSION:  1.  Pain, right hip.  2.  Herniated nucleus pulposus L4-L5, right.  3.  Weakness, right extensor hallucis longus.  4.  Degenerative disk disease, lumbosacral spine.  5.  Osteoarthritis, right hip.  6.  Femoroacetabular impingement (FAI), right hip.  7.  Trochanteric bursitis, right hip, resolved.     The patient and her husband advised that her degree of pain, being unable  to find a comfortable position, even while she is sitting she cannot find a  comfortable position, is not indicative of an arthritic hip, an FAI or hip  bursitis.  It is more indicative of pinched nerve in her back as the  source  of the pain.  Also the new finding of weakness in the right EHL which was  not present on last exam also suggests that this is a pinched nerve problem  as the source of this more significant pain not a hip joint problem per se.   The examination today on the hip is virtually benign.     Based on today's examination and evaluation this does not appear to be hip  joint problem as the source of pain, but rather a pinched nerve in the back  that is the source of the pain.  She is placed back on another Medrol  Dosepak for symptoms and she was advised to return to Rosemarie Beath, MD,  her neurosurgeon, for further evaluation and treatment.  She will follow up  here p.r.n.

## 2016-04-04 NOTE — Patient Instructions (Addendum)
Good posture, careful bending and lifting (at knees not waist), low impact activities only, maintain good body weight  Medrol as needed for pain, heating pad as needed  Physical therapy as directed.  Follow-up with Dr. Quinn Axe

## 2016-04-04 NOTE — ED Provider Notes (Signed)
[facility] EMERGENCY DEPARTMENT H&P      Visit date: 04/04/2016      CLINICAL SUMMARY          Diagnosis:    .     Final diagnoses:   Lumbar radiculopathy   Intractable pain   Pain of right hip joint         MDM Notes:      64F p/w intractable pain to right hip, ?neuropathic pain given description and exam, not tender on exam, no pain with movement of right hip, NOT septic joint, clinically not bursitis, negative straight leg raise, slightly blunted strength in toes of right and reflexes in RLE, no bowel/bladder difficulties/incontinence, consulted ortho and nsgy, pain unrelieved in ED with narcotics and benzos, MRI L spine ordered, admitted for pain control and further evaluation.          Disposition:         Observation Admit      ED Disposition     Observation Admitting Physician: Sheran Luz [facility] [[id number]]  Diagnosis: Lumbar radiculopathy [130865]  Estimated Length of Stay: < 2 midnights  Tentative Discharge Plan?: Home or Self Care [1]  Patient Class: Observation [104]                        CLINICAL INFORMATION        HPI:      Chief Complaint: Hip Pain  .    Paula Trujillo is a 60 y.o. female with h/o gastric bypass and lumbar disc disease who presents with chronic Rt sided hip pain. Pt notes pain has been ongoing since February but would only spark up about 1x/mos and in the last 2 weeks would shoot up 3-4x/day and pt notes she is in severe pain upon arrival to ED. Denies pain down her Rt leg or radiating back pain associated with hip pain. Denies any injury or trauma. She notes nothing alleviates the pain or worsens it as it is spontaneous and not related to any activity. Pt reports she has tried many medications including Hydrocodone, Oxycodone, and a Perazone and Cortisone injection (at differet times) and many more but has stopped taking any pain medications because she reports "they do not help anything at all so why should I be taking them". Pt had an appt last Thurs with  Dr. Lewie Chamber where she had an MRI done and another appt today - was not referred to ED but came in due to persistent pain. She has also sees nsgy Dr. Deloria Lair for disc disease and has been to physical therapy, had back steroid injections as well. Nothing helps. Pt feels at the end of her rope.   Pt also shows concern that if she's driving between [place] (home) to work in [place] that "she may steer off the road" or "be a danger to anyone around (her) in general or herself as she cannot control the pain" during each episode.   No trauma, no fever. Has also just finished medrol dose pack which helped in past but did not provide any relief this time.     Reports she cannot have any NSAIDs due to a h/o gastric bypass.     MRI non-contrast of Rt hip from June 8th shows:   Mild to moderate right and mild Lft hip osteoarthritis. There are bilateral underlying cam deformities which predisposes to femoral acetabular impingement.   Mild to moderate right greater than left gluteus minimus and medius tendinopathy. Mild  right trochanteric bursitis.     History obtained from: patient, family        ROS:      Positive and negative ROS elements as per HPI.  All other systems reviewed and negative.      Physical Exam:      Pulse 96  BP 130/75 mmHg  Resp 18  SpO2 100 %  Temp 98.2 F (36.8 C)    GEN: Uncomfortable appearing, severe distress.   HEENT: Atraumatic, nl conjunctiva, PERRL. MMM.   Neck: No midline tenderness.       Chest: CTAB, no resp distress.        CV: RRR, no murmurs.        Abd: Soft, non-tender, non-distended.          MSK: No edema. Well perfused. No TTP of pelvis or femur. No pain w log roll or flexion at hip joint. Negative straight and crossed leg raise.    Back: No midline or paraspinal tenderness. Normal ROM.   Neuro: GCS 15, normal speech, normal memory. 5/5 hip flexors b/l, 4/5 right great toe flexion, sensation intact to light touch x4, normal gait. 1+ right patellar reflex, 2+ left patellar reflex.          Skin: Warm, dry. No erythema or warmth over rt hip.   Psych: Normal affect, normal insight.             PAST HISTORY        Primary Care Provider: Patsy Lager, MD        PMH/PSH:    .     Past Medical History   Diagnosis Date   . Lower back pain    . Neck pain        She has no past surgical history on file.      Social/Family History:      She reports that she has never smoked. She does not have any smokeless tobacco history on file. She reports that she does not drink alcohol or use illicit drugs.    History reviewed. No pertinent family history.      Listed Medications on Arrival:    .     Home Medications                   albuterol (PROVENTIL HFA;VENTOLIN HFA) 108 (90 Base) MCG/ACT inhaler     Inhale 2 puffs into the lungs.     albuterol-ipratropium (COMBIVENT RESPIMAT) 20-100 MCG/ACT Aero Soln          celecoxib (CELEBREX) 200 MG capsule          Estrogens Conjugated (PREMARIN PO)     Take by mouth.     methylPREDNISolone (MEDROL DOSPACK) 4 MG tablet     follow package directions     olmesartan-hydrochlorothiazide (BENICAR HCT) 20-12.5 MG per tablet          oxyCODONE 10 MG Tab                        Allergies: She has No Known Allergies.            VISIT INFORMATION        Clinical Course in the ED:      3:45 PM Attempted to call Dr. Lewie Chamber - was on hold for 20 minutes.   4:00 PM D/w Ortho res on call, recommended xrays and call back with results.   4:14 PM Attempted to call Dr. Lewie Chamber  again, was on hold and not reached.   5:20 PM Re-eval - pain is unchanged but does not want anymore Dilaudid.   6:28 PM Ortho resident came to consult pt but said they would return once pt got a room.  7:30 PM Ortho has seen; exam not c/w hip process. Concern for spine/peripheral neuropathy.    8:39 PM D/w NSGY PA - will come evaluate. Requests CT of L spine.  10:00 PM D/w CNS hospitalist for admission.        Medications Given in the ED:    .     ED Medication Orders     Start Ordered     Status Ordering Provider     04/04/16 2015 04/04/16 2014  ondansetron (ZOFRAN) injection 4 mg   Once     Route: Intravenous  Ordered Dose: 4 mg     Last MAR action:  Given Koleen Distance    04/04/16 2007 04/04/16 2006  HYDROmorphone (DILAUDID) injection 1 mg   Once     Route: Intravenous  Ordered Dose: 1 mg     Last MAR action:  Given Koleen Distance    04/04/16 1917 04/04/16 1916  LORazepam (ATIVAN) injection 1 mg   Once     Route: Intravenous  Ordered Dose: 1 mg     Last MAR action:  Given Koleen Distance    04/04/16 1551 04/04/16 1550  HYDROmorphone (DILAUDID) injection 0.5 mg   Once     Route: Intravenous  Ordered Dose: 0.5 mg     Last MAR action:  Given Koleen Distance    04/04/16 1537 04/04/16 1536     Once,   Status:  Discontinued     Route: Oral  Ordered Dose: 2 tablet     Discontinued Velita Quirk M            Procedures:      Procedures      Interpretations:      O2 sat-           saturation: 100 %; Oxygen use: room air; Interpretation: Normal    DDx-              Initial differential diagnosis included: nerve impingement, radiculopathy, spinal stenosis, peripheral neuropathy               RESULTS        Lab Results:      Results     Procedure Component Value Units Date/Time    Sedimentation rate (ESR) [[id number]] Collected:  04/04/16 1616    Specimen Information:  Blood Updated:  04/04/16 1731     Sed Rate 20 mm/Hr     GFR [[id number]] Collected:  04/04/16 1616     EGFR >60.0 Updated:  04/04/16 1639    Basic Metabolic Panel [[id number]]  (Abnormal) Collected:  04/04/16 1616    Specimen Information:  Blood Updated:  04/04/16 1639     Glucose 107 (H) mg/dL      BUN 08.6 mg/dL      Creatinine 0.8 mg/dL      Calcium 8.9 mg/dL      Sodium 578 mEq/L      Potassium 4.1 mEq/L      Chloride 107 mEq/L      CO2 25 mEq/L     C Reactive Protein [[id number]] Collected:  04/04/16 1616    Specimen Information:  Blood Updated:  04/04/16 1639     C-Reactive Protein <0.1 mg/dL  CBC with differential [[id number]]  (Abnormal) Collected:  04/04/16  1616    Specimen Information:  Blood from Blood Updated:  04/04/16 1629     WBC 6.71 x10 3/uL      Hgb 9.8 (L) g/dL      Hematocrit 40.9 (L) %      Platelets 303 x10 3/uL      RBC 3.75 (L) x10 6/uL      MCV 81.6 fL      MCH 26.1 (L) pg      MCHC 32.0 g/dL      RDW 15 %      MPV 9.6 fL      Neutrophils 61.3 %      Lymphocytes Automated 31.0 %      Monocytes 6.6 %      Eosinophils Automated 0.7 %      Basophils Automated 0.1 %      Immature Granulocyte 0.3 %      Nucleated RBC 0.0 /100 WBC      Neutrophils Absolute 4.11 x10 3/uL      Abs Lymph Automated 2.08 x10 3/uL      Abs Mono Automated 0.44 x10 3/uL      Abs Eos Automated 0.05 x10 3/uL      Absolute Baso Automated 0.01 x10 3/uL      Absolute Immature Granulocyte 0.02 x10 3/uL      Absolute NRBC 0.00 x10 3/uL               Radiology Results:      CT L- Spine without Contrast   Final Result       1. No lumbar spine fracture is detected.    2. Mild degenerative changes of the lumbar spine most prominent at L1-L2   and L4-L5.      Neldon Mc, MD    04/04/2016 9:41 PM         Hip Right AP and Lateral with Pelvis   Final Result      1. No evidence of acute bony process.      Meryle Ready, MD    04/04/2016 4:55 PM         MRI L-spine with/without Contrast    (Results Pending)               Scribe Attestation:      I was acting as a scribe for Koleen Distance, MD on Caravello,Zoelle M  Treatment Team: Scribe: Kelli Churn     I am the first provider for this patient and I personally performed the services documented. Treatment Team: Scribe: Kelli Churn is scribing for me on Gosdin,Graycen M. This note and the patient instructions accurately reflect work and decisions made by me.  Koleen Distance, MD         Koleen Distance, MD  04/05/16 [phone]

## 2016-04-05 ENCOUNTER — Observation Stay: Payer: BLUE CROSS/BLUE SHIELD

## 2016-04-05 ENCOUNTER — Encounter: Payer: Self-pay | Admitting: Hospitalist

## 2016-04-05 DIAGNOSIS — M5136 Other intervertebral disc degeneration, lumbar region: Secondary | ICD-10-CM

## 2016-04-05 DIAGNOSIS — R52 Pain, unspecified: Secondary | ICD-10-CM | POA: Diagnosis present

## 2016-04-05 DIAGNOSIS — M5126 Other intervertebral disc displacement, lumbar region: Secondary | ICD-10-CM

## 2016-04-05 DIAGNOSIS — M25551 Pain in right hip: Secondary | ICD-10-CM | POA: Diagnosis present

## 2016-04-05 DIAGNOSIS — M5416 Radiculopathy, lumbar region: Secondary | ICD-10-CM | POA: Diagnosis present

## 2016-04-05 LAB — CBC AND DIFFERENTIAL
Absolute NRBC: 0 10*3/uL
Basophils Absolute Automated: 0.01 10*3/uL (ref 0.00–0.20)
Basophils Automated: 0.2 %
Eosinophils Absolute Automated: 0.05 10*3/uL (ref 0.00–0.70)
Eosinophils Automated: 0.8 %
Hematocrit: 28.7 % — ABNORMAL LOW (ref 37.0–47.0)
Hgb: 9.1 g/dL — ABNORMAL LOW (ref 12.0–16.0)
Immature Granulocytes Absolute: 0.02 10*3/uL
Immature Granulocytes: 0.3 %
Lymphocytes Absolute Automated: 1.7 10*3/uL (ref 0.50–4.40)
Lymphocytes Automated: 28.1 %
MCH: 25.7 pg — ABNORMAL LOW (ref 28.0–32.0)
MCHC: 31.7 g/dL — ABNORMAL LOW (ref 32.0–36.0)
MCV: 81.1 fL (ref 80.0–100.0)
MPV: 9.6 fL (ref 9.4–12.3)
Monocytes Absolute Automated: 0.48 10*3/uL (ref 0.00–1.20)
Monocytes: 7.9 %
Neutrophils Absolute: 3.78 10*3/uL (ref 1.80–8.10)
Neutrophils: 62.7 %
Nucleated RBC: 0 /100 WBC (ref 0.0–1.0)
Platelets: 269 10*3/uL (ref 140–400)
RBC: 3.54 10*6/uL — ABNORMAL LOW (ref 4.20–5.40)
RDW: 15 % (ref 12–15)
WBC: 6.04 10*3/uL (ref 3.50–10.80)

## 2016-04-05 LAB — COMPREHENSIVE METABOLIC PANEL
ALT: 9 U/L (ref 0–55)
AST (SGOT): 12 U/L (ref 5–34)
Albumin/Globulin Ratio: 1 (ref 0.9–2.2)
Albumin: 2.8 g/dL — ABNORMAL LOW (ref 3.5–5.0)
Alkaline Phosphatase: 35 U/L — ABNORMAL LOW (ref 37–106)
BUN: 16 mg/dL (ref 7.0–19.0)
Bilirubin, Total: 0.3 mg/dL (ref 0.2–1.2)
CO2: 24 mEq/L (ref 22–29)
Calcium: 8.8 mg/dL (ref 8.5–10.5)
Chloride: 109 mEq/L (ref 100–111)
Creatinine: 0.8 mg/dL (ref 0.6–1.0)
Globulin: 2.9 g/dL (ref 2.0–3.6)
Glucose: 95 mg/dL (ref 70–100)
Potassium: 3.8 mEq/L (ref 3.5–5.1)
Protein, Total: 5.7 g/dL — ABNORMAL LOW (ref 6.0–8.3)
Sodium: 140 mEq/L (ref 136–145)

## 2016-04-05 LAB — CK: Creatine Kinase (CK): 51 U/L (ref 29–168)

## 2016-04-05 LAB — HEMOGLOBIN A1C
Average Estimated Glucose: 122.6 mg/dL
Hemoglobin A1C: 5.9 % (ref 4.6–5.9)

## 2016-04-05 LAB — GFR: EGFR: >60

## 2016-04-05 MED ORDER — HYDROMORPHONE HCL 1 MG/ML IJ SOLN
1.0000 mg | Freq: Once | INTRAMUSCULAR | Status: DC
Start: 2016-04-05 — End: 2016-04-10
  Filled 2016-04-05: qty 1

## 2016-04-05 MED ORDER — VALSARTAN 160 MG PO TABS
ORAL_TABLET | Freq: Every day | ORAL | Status: DC
Start: 2016-04-05 — End: 2016-04-10
  Filled 2016-04-05 (×9): qty 1

## 2016-04-05 MED ORDER — GADOBUTROL 1 MMOL/ML IV SOLN
6.0000 mL | Freq: Once | INTRAVENOUS | Status: AC | PRN
Start: 2016-04-05 — End: 2016-04-06
  Administered 2016-04-06: 6 mmol via INTRAVENOUS

## 2016-04-05 MED ORDER — GABAPENTIN 300 MG PO CAPS
300.0000 mg | ORAL_CAPSULE | Freq: Three times a day (TID) | ORAL | Status: DC
Start: 2016-04-05 — End: 2016-04-09
  Administered 2016-04-05 – 2016-04-09 (×12): 300 mg via ORAL
  Filled 2016-04-05 (×12): qty 1

## 2016-04-05 MED ORDER — CYCLOBENZAPRINE HCL 10 MG PO TABS
10.0000 mg | ORAL_TABLET | Freq: Three times a day (TID) | ORAL | Status: DC
Start: 2016-04-05 — End: 2016-04-10
  Administered 2016-04-05 – 2016-04-10 (×15): 10 mg via ORAL
  Filled 2016-04-05 (×18): qty 1

## 2016-04-05 MED ORDER — HYDROMORPHONE HCL 2 MG PO TABS
2.0000 mg | ORAL_TABLET | ORAL | Status: DC | PRN
Start: 2016-04-05 — End: 2016-04-10
  Administered 2016-04-05 – 2016-04-10 (×21): 2 mg via ORAL
  Filled 2016-04-05 (×21): qty 1

## 2016-04-05 MED ORDER — HYDROMORPHONE HCL 1 MG/ML IJ SOLN
1.0000 mg | Freq: Once | INTRAMUSCULAR | Status: AC
Start: 2016-04-05 — End: 2016-04-05
  Administered 2016-04-05: 1 mg via INTRAVENOUS
  Filled 2016-04-05: qty 1
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
## 2016-04-05 NOTE — Consults (Signed)
Orthopaedic Consult    Date Time: 04/05/2016 3:00 AM  Patient Name: Paula Masse, MD Attending Physician    Time first seen by orthopaedics: 2000      Assessment & Plan    Orthopaedic assessment:  R sided lower extremity radicular symptoms with mixed upper and lower motor neuron findings    Reductions/Procedures/Splinting performed (indicate type of Anesthesia used):  None    Plan:    Based on clinical exam, imaging, and prior history, it does not seem that her pain is originating in her right hip joint, or trochanteric bursa.  Given the electrical and paroxysmal nature of her pain, along with clinical exam findings, a neural source seems more likely. Given the change in pain over the last several months, as well as occurrence of the pain at night, that wakes her from sleep, repeat advanced imaging of the spine, may be reasonable.       HPI    Paula Trujillo is a 60 y.o. year old female with worsening pain that she localizes to the right lateral hip area.  Pain is sharp, electrical and varies in intensity.  She denies any aggravating or alleviating factors including weight-bearing, range of motion, coughing, defecating or urinating.  She reports some prior improvement with an oral steroid taper, but a recent medrol dosepak failed to provide relief.  She reports several prior lumbar epidural injections (unclear if selective nerve root blocks or not) at L2 and L5 that did not provide benefit, however the L2 injection caused motor weakness in her RLE.  She had a corticosteroid injection in her trochanteric bursa that did not provide relief. She has previously tried neurontin and nucynta as well as narcotics with minimal lasting relief. Unable to take NSAIDs due to gastric bypass surgery. She has been evaluated previously by Orthopaedics for R hip involvement, as well as neurosurgery.  She was seen by her Orthopaedist today and presents to the ED following that appointment, tearful and  in severe pain.  Orthopaedics consulted for second-opinion and clinical evaluation.    Past Medical and Surgical History      Past Medical History   Diagnosis Date   . Lower back pain    . Neck pain         History reviewed. No pertinent past surgical history.    Past Social History & Family History   Social History:   Social History     Social History   . Marital Status: Married     Spouse Name: N/A   . Number of Children: N/A   . Years of Education: N/A     Social History Main Topics   . Smoking status: Never Smoker    . Smokeless tobacco: Not on file   . Alcohol Use: No   . Drug Use: No   . Sexual Activity: Not on file     Other Topics Concern   . Not on file     Social History Narrative       Family History: History reviewed. No pertinent family history.    Relevant Family & Social  history reviewed.  No pertinent family history or social history.      Review of Systems:   All other systems were reviewed and are negative except: as per HPI. No chest paun            Home Medications     Prior to Admission medications    Medication Sig  Start Date End Date Taking? Authorizing Provider   Estrogens Conjugated (PREMARIN PO) Take by mouth.   Yes [provider]   gabapentin (NEURONTIN) 100 MG capsule Take 100 mg by mouth 3 (three) times daily.   Yes [provider]   olmesartan-hydrochlorothiazide (BENICAR HCT) 20-12.5 MG per tablet    Yes [provider]   tapentadol HCl (NUCYNTA) 50 MG Tab tablet Take 50 mg by mouth 2 (two) times daily.   Yes [provider]   albuterol (PROVENTIL HFA;VENTOLIN HFA) 108 (90 Base) MCG/ACT inhaler Inhale 2 puffs into the lungs.    [provider]   albuterol-ipratropium (COMBIVENT RESPIMAT) 20-100 MCG/ACT Aero Soln     [provider]   celecoxib (CELEBREX) 200 MG capsule  12/25/15   [provider]   methylPREDNISolone (MEDROL DOSPACK) 4 MG tablet follow package directions 04/04/16   Lilyan Punt, MD   oxyCODONE 10 MG Tab   02/08/16   [provider]       Allergies   No Known Allergies    Radiology Studies: (actual Orthopaedically relevant films reviewed and read by Orthopaedics)   Pelvis and hip radiographs w/o fracture or significant joint space narrowing. MRI report reviewed.                              Physical Exam:     Patient is a 59 y.o. year old female who is alert, well appearing, and in no distress, anxious and in mild to moderate distress, mood is agitated  Orientation: Fully Oriented    BP 137/82 mmHg  Pulse 68  Temp(Src) 97.5 F (36.4 C) (Oral)  Resp 16  Ht 1.702 m (5\' 7" )  Wt 60.328 kg (133 lb)  BMI 20.83 kg/m2  SpO2 100%  60.328 kg (133 lb)   1.702 m (5\' 7" )    Gait: normal, pacing at times, unable to get comfortable    Heart: regular rate  Lungs: unlabored      Right Upper Extremity:   Inspection:  No swelling, erythema, deformity, atrophy or hypertrophy noted  Palpation:  Tenderness-none  ROM:  within normal limits  Joint Stability: normal  Strength: normal  Skin: normal   Peripheral Vascular: normal  Reflexes: Brisk triceps and BR reflex, mild Hoffman's observed  Sensation: Intact to light touch C5/C6/C7/C8/T1      Right Lower Extremity:   Inspection:  No swelling, erythema, deformity, atrophy or hypertrophy noted  Palpation:  Tenderness-none. No tenderness at troch bursa.  ROM:  within normal limits.   Provocative: No pain with logroll, FADIR, Patrick/FABER, femoral nerve stretch/SLR/Crossed SLR. Mild increase in symptoms with maximal iliopsoas resistance testing of contralateral leg  Joint Stability: normal  Strength: 4+/5 Hip flexors/EHL 5/5 Abductors/Quads/TA/GSC  Skin: normal   Peripheral Vascular: pulse present  Reflexes: hypoactive knee jerk different than LLE. No babinski response, no clonus  Sensation: Sensate L2-S1 but with paresthesias throughout leg        Left Upper Extremity:   Inspection:  No swelling, erythema, deformity, atrophy or hypertrophy noted  Palpation:   Tenderness-none  ROM:  within normal limits  Joint Stability: normal  Strength: normal  Skin: normal   Peripheral Vascular: normal  Reflexes: Brisk triceps and BR reflex, mild Hoffman's observed  Sensation: Intact to light touch C5/C6/C7/C8/T1      Left Lower Extremity:   Inspection:  No swelling, erythema, deformity, atrophy or hypertrophy noted  Palpation:  Tenderness-none  ROM:  within normal limits.   Provocative: No pain with logroll, FADIR, Patrick/FABER, femoral nerve stretch/SLR/Crossed SLR  Joint Stability: normal  Strength: 5/5 Hip flexorsAbductors/Quads/TA//EHL/GSC  Skin: normal   Peripheral Vascular: pulse present  Reflexes: hypoactive knee jerk different than LLE. No babinski response, no clonus  Sensation: Sensate L2-S1 but with paresthesias throughout legout leg     Pelvis:     Skin: normal  Palpation: Tenderness- none. Neg tinel at cluneal nerves or LFCN  Stability: normal

## 2016-04-05 NOTE — UM Notes (Signed)
60 YEAR OLD FEMALE CAME TO [facility] ER WITH C/O INTRACTABLE R HIP PAIN. PAIN BEGAN IN February, HAS HAD MULTIPLE TREATMENTS WITHOUT EFFECT. WAS IN DR OFFICE SCREAMING IN PAIN AND SENT TO ER. WANTS TO FIND SOURCE OF PAIN. HAD PELVIC PAIN AND DX WITH ENDOMETRIOSIS. HAD HYSTERECTOMY BUT PAIN RETURNED AFTER 6 MO. SEEN AT PAIN CLINIC FOR 4 YRS.    VS- BP- 122/74, P- 96, R- 14, T- 98.2  LOP 10/10    LABS- H/H- 9.1 / 28, ALK PHOS 35, ALBUMIN 2.8, PROTEIN 5.7    R HIP XRAY- NO ACUTE PROCESS  CT L SPINE- Mild degenerative changes of the lumbar spine most prominent at L1-L2 and L4-L5.  MRI L SPINE-  Multilevel lumbar spondylosis most prominent at L4-L5 and L5-S1.    2. At L4-L5 there is effacement of the lateral recess bilaterally and  contact of the traversing L5 nerve roots. There is mild right-sided  foraminal narrowing.    3. At L5-S1 there is a small right paracentral disc protrusion  contacting the traversing right S1 nerve root as well as resultant mass  effect upon the thecal sac. There is mild central canal stenosis and  mild bilateral foraminal narrowing.    4. Multilevel facet arthropathy.    PMH- LOWER BACK PAIN, NECK PAIN, HYSTERECTOMY, GASTRIC BYPASS, HTN    PLAN- ADMIT TO OBSERVATION CLASS 04/04/16 2204    NEUROSURGERY CONSULT, PT/OT, ANALGESIA, NEURONTIN, HOME MEDS, LOVENOX PROPH     Back Pain: Observation Care - Observation Care Admission Criteria Met

## 2016-04-05 NOTE — Progress Notes (Signed)
[facility] HOSPITALIST PROGRESS NOTE    Date Time: 04/05/2016 2:04 PM  Patient Name: Paula Trujillo  Attending Physician: Emily Filbert, DO    Assessment:     Patient Active Problem List   Diagnosis   . DDD (degenerative disc disease), lumbar   . Right hip pain   . Chronic midline low back pain with sciatica   . Trochanteric bursitis, right hip   . HNP (herniated nucleus pulposus), lumbar   . Weakness of right leg   . Intractable pain   . Intractable right hip pain       60 yo female hx gastric bypass, chronic pelvic pain s/p hysterectomy, HTN who presents with intractable right hip pain.   Plan:   Intractable right hip pain - possibly 2/2 to L5-S1 disc herniation and DDD  Neurosurgery recommended MRI lumbar spine with contrast which showed DDD and L5-S1 disc herniation  PT/OT recommended home with o/p PT   Analgesia.   Completed steroid taper outpatient  Increased Neurontin to 300mg  po tid  ESR/CRP normal.   Consulted Pain mgmt and will consider a repeat epidural steroid injection under sedation.    Discussed with NS and appreciate input.  No surgery per team.    She would benefit from a [facility]-based tertiary care center.      Anemia - Follow.  CBC daily.      Hx gastric bypass, chronic pelvic pain s/p hysterectomy, HTN - Home meds.    DVT/GI prophylaxis - Lovenox, SCDs.     Status/Rationale: Observation spine ward.       Anticipated discharge disposition and date: 1-2 days    CC: Right pelvic/hip pain    History of Presenting Illness and Interval History/24 hour Events:   Paula Trujillo is a 60 y.o. female hx gastric bypass, chronic pelvic pain s/p hysterectomy, HTN who presents to the hospital with intractable right hip pain. This started suddenly 11/2015. She was seen at the [facility] and had X-rays with concern for lumbar stenosis so went to [facility] (pain management) and received spine injections in the lower and upper lumbar spines which were ineffective. She then went to Dr. Deloria Lair and Dr. Lewie Chamber to look  for a cause for her pain. She had bursal right hip injections which didn't help. She tried several analgesics which didn't help. She had MRI lumbar spine and MRI hip which showed minor disease which didn't explain her problems. She notes difficulty walking and sitting because of pain. She saw Dr. Lewie Chamber who noted she had severe pain and was screaming so sent her to the ER. The patient doesn't want analgesics but rather wants to find the cause of her pain. These symptoms are sudden onset, moderate intensity, without alleviating factors.     She mentioned having severe pelvic pain in the distant past and initially being diagnosed with endometriosis so she had hysterectomy. The pain was relieved for 6 months then recurred. She ultimately went to [facility] who felt that she had chronic pelvic pain and referred her to pain management but ultimately she stopped going after 4 years because the medications didn't seem to help.    6/16:  Pt was very anxious today.  Her husband and the pt were demanding an immediate solutions to her right hip pain.  June, RN and I informed the pt that we could help relieve her pain while we coordinate a treatment plan for her right hip/LE pain.  Pt tried oral steroids outpatient without success.  Muscle relaxants  were added today.  Case discussed with NS and no plans for surgery.  Pain mgmt was consulted for her pain.      Physical Exam:     Filed Vitals:    04/05/16 0726   BP: 123/67   Pulse: 80   Temp: 97.6 F (36.4 C)   Resp: 18   SpO2: 93%       Intake and Output Summary (Last 24 hours) at Date Time  No intake or output data in the 24 hours ending 04/05/16 1404    General: awake, alert, oriented x 3; mild distress.  HEENT: perrla, eomi, sclera anicteric oropharynx clear without lesions, mucous membranes moist  Neck: supple, no lymphadenopathy, no thyromegaly, no JVD, no carotid bruits  Cardiovascular: regular rate and rhythm, no murmurs, rubs or gallops  Lungs: clear to auscultation  bilaterally, without wheezing, rhonchi, or rales  Abdomen: soft, non-tender, non-distended; no palpable masses, no hepatosplenomegaly, normoactive bowel sounds, no rebound or guarding  Extremities: no clubbing, cyanosis, or edema  Neuro: cranial nerves grossly intact, strength 5/5 in upper and lower extremities, sensation intact, follows commands 100% of the time, negative SLR bilat  Skin: no rashes or lesions noted    Medications:     Current Facility-Administered Medications   Medication Dose Route Frequency   . cyclobenzaprine  10 mg Oral Q8H   . enoxaparin  40 mg Subcutaneous QHS   . gabapentin  300 mg Oral Q8H SCH   . HYDROmorphone  1 mg Intravenous Once   . HYDROmorphone  1 mg Intravenous Once   . valsartan-hydroCHLOROthiazide  160-12.5 (DIOVAN HCT) combo dose   Oral Daily         Labs:     Results     Procedure Component Value Units Date/Time    Hemoglobin A1C [[id number]] Collected:  04/05/16 0408    Specimen Information:  Blood Updated:  04/05/16 1245     Hemoglobin A1C 5.9 %      Average Estimated Glucose 122.6 mg/dL     Comprehensive metabolic panel [[id number]]  (Abnormal) Collected:  04/05/16 0408    Specimen Information:  Blood Updated:  04/05/16 0515     Glucose 95 mg/dL      BUN 78.2 mg/dL      Creatinine 0.8 mg/dL      Sodium 956 mEq/L      Potassium 3.8 mEq/L      Chloride 109 mEq/L      CO2 24 mEq/L      Calcium 8.8 mg/dL      Protein, Total 5.7 (L) g/dL      Albumin 2.8 (L) g/dL      AST (SGOT) 12 U/L      ALT 9 U/L      Alkaline Phosphatase 35 (L) U/L      Bilirubin, Total 0.3 mg/dL      Globulin 2.9 g/dL      Albumin/Globulin Ratio 1.0     CREATINE KINASE LEVEL (CK) [[id number]] Collected:  04/05/16 0408    Specimen Information:  Blood Updated:  04/05/16 0515     Creatine Kinase (CK) 51 U/L     GFR [[id number]] Collected:  04/05/16 0408     EGFR >60.0 Updated:  04/05/16 0515    CBC with diff [[id number]]  (Abnormal) Collected:  04/05/16 0408    Specimen Information:  Blood from Blood Updated:   04/05/16 0446     WBC 6.04 x10 3/uL      Hgb 9.1 (L) g/dL  Hematocrit 28.7 (L) %      Platelets 269 x10 3/uL      RBC 3.54 (L) x10 6/uL      MCV 81.1 fL      MCH 25.7 (L) pg      MCHC 31.7 (L) g/dL      RDW 15 %      MPV 9.6 fL      Neutrophils 62.7 %      Lymphocytes Automated 28.1 %      Monocytes 7.9 %      Eosinophils Automated 0.8 %      Basophils Automated 0.2 %      Immature Granulocyte 0.3 %      Nucleated RBC 0.0 /100 WBC      Neutrophils Absolute 3.78 x10 3/uL      Abs Lymph Automated 1.70 x10 3/uL      Abs Mono Automated 0.48 x10 3/uL      Abs Eos Automated 0.05 x10 3/uL      Absolute Baso Automated 0.01 x10 3/uL      Absolute Immature Granulocyte 0.02 x10 3/uL      Absolute NRBC 0.00 x10 3/uL     Sedimentation rate (ESR) [[id number]] Collected:  04/04/16 1616    Specimen Information:  Blood Updated:  04/04/16 1731     Sed Rate 20 mm/Hr     GFR [[id number]] Collected:  04/04/16 1616     EGFR >60.0 Updated:  04/04/16 1639    Basic Metabolic Panel [[id number]]  (Abnormal) Collected:  04/04/16 1616    Specimen Information:  Blood Updated:  04/04/16 1639     Glucose 107 (H) mg/dL      BUN 08.6 mg/dL      Creatinine 0.8 mg/dL      Calcium 8.9 mg/dL      Sodium 578 mEq/L      Potassium 4.1 mEq/L      Chloride 107 mEq/L      CO2 25 mEq/L     C Reactive Protein [[id number]] Collected:  04/04/16 1616    Specimen Information:  Blood Updated:  04/04/16 1639     C-Reactive Protein <0.1 mg/dL     CBC with differential [[id number]]  (Abnormal) Collected:  04/04/16 1616    Specimen Information:  Blood from Blood Updated:  04/04/16 1629     WBC 6.71 x10 3/uL      Hgb 9.8 (L) g/dL      Hematocrit 27.2 (L) %      Platelets 303 x10 3/uL      RBC 3.75 (L) x10 6/uL      MCV 81.6 fL      MCH 26.1 (L) pg      MCHC 32.0 g/dL      RDW 15 %      MPV 9.6 fL      Neutrophils 61.3 %      Lymphocytes Automated 31.0 %      Monocytes 6.6 %      Eosinophils Automated 0.7 %      Basophils Automated 0.1 %      Immature Granulocyte 0.3 %       Nucleated RBC 0.0 /100 WBC      Neutrophils Absolute 4.11 x10 3/uL      Abs Lymph Automated 2.08 x10 3/uL      Abs Mono Automated 0.44 x10 3/uL      Abs Eos Automated 0.05 x10 3/uL      Absolute Baso Automated 0.01  x10 3/uL      Absolute Immature Granulocyte 0.02 x10 3/uL      Absolute NRBC 0.00 x10 3/uL             Radiology:     Radiology Results (24 Hour)     Procedure Component Value Units Date/Time    MRI L-spine with/without Contrast [[id number]] Collected:  04/05/16 0815    Order Status:  Completed Updated:  04/05/16 0842    Narrative:      CLINICAL HISTORY: 60 year old female with right hip and right lower  extremity pain.    TECHNIQUE: On a 3 Tesla system, the lumbar spine was imaged utilizing T1  and T2-weighted images in orthogonal planes before and after the  administration of intravenous contrast.  6 cc of Gadavist was  administered.    Comparison is made to CT of the lumbar spine dated 04/04/2016.    FINDINGS:  Please note that multiple sequences are degraded secondary to patient  motion.    The lumbar spine is normally aligned. The vertebral body heights are  maintained. The marrow signal is unremarkable. The conus medullaris is  normal in appearance and terminates behind the L1 vertebral body.  Individual levels are as follows:    L1-L2: There is mild disc space narrowing and a small disc bulge which  is slightly asymmetric towards the right. There is no central canal  stenosis. There is no foraminal narrowing. There is mild facet  arthropathy.    L2-L3: Normal disc signal and morphology. No central canal or foraminal  narrowing. There is mild facet arthropathy.    L3-L4: Normal disc signal and morphology. No central canal or foraminal  narrowing. There is mild facet arthropathy.       L4-L5: There is disc space narrowing and disc desiccation. There is a  diffuse disc bulge with bilateral foraminal extension as well as  extension into the lateral recess bilaterally.  There is contact of  the  traversing L5 nerve roots bilaterally. There is facet arthropathy and  ligamentum flavum redundancy. There is no central canal stenosis. There  is mild right foraminal narrowing. The left foramen is patent.     L5-S1: There is a mild disc bulge with a superimposed right paracentral  disc protrusion best demonstrated on image 4, series 7 there is contact  of the traversing right S1 nerve root as well as posterior and leftward  displacement of the thecal sac. There is mild central canal stenosis.  There is mild bilateral foraminal narrowing. There is facet arthropathy.    Post contrast imaging demonstrates no abnormal enhancement.      Impression:         1.  Multilevel lumbar spondylosis most prominent at L4-L5 and L5-S1.    2.  At L4-L5 there is effacement of the lateral recess bilaterally and  contact of the traversing L5 nerve roots. There is mild right-sided  foraminal narrowing.    3.  At L5-S1 there is a small right paracentral disc protrusion  contacting the traversing right S1 nerve root as well as resultant mass  effect upon the thecal sac.  There is mild central canal stenosis and  mild bilateral foraminal narrowing.    4.  Multilevel facet arthropathy.    Neldon Mc, MD   04/05/2016 8:38 AM      CT L- Spine without Contrast [[id number]] Collected:  04/04/16 2133    Order Status:  Completed Updated:  04/04/16 2145    Narrative:  HISTORY: Right hip pain.    COMPARISON: None.    TECHNIQUE: Non-contrast CT of  the the lumbar spine was performed.   Reconstructed 2D coronal and sagittal images were obtained with the  data.  The following dose reduction techniques were utilized: automated  exposure control and/or adjustment of the mA and/or kV according to  patient's size, and the use of iterative reconstruction technique.      FINDINGS:       The bones of the lumbar spine are aligned. No fracture is seen.  There  is no evidence of a pars interarticularis defect in the lumber spine.  There are mild  degenerative changes along the disc margin at multiple  levels of the lumbar spine most prominent at L1-L2 and L4-L5.    There are punctate calcifications within the kidneys bilaterally which  may represent nonobstructing nephrolithiasis.        Impression:         1. No lumbar spine fracture is detected.   2. Mild degenerative changes of the lumbar spine most prominent at L1-L2  and L4-L5.    Neldon Mc, MD   04/04/2016 9:41 PM      Hip Right AP and Lateral with Pelvis [[id number]] Collected:  04/04/16 1651    Order Status:  Completed Updated:  04/04/16 1659    Narrative:      History: Right hip pain.       Frontal view of the pelvis and 2 views of the right hip obtained. No  apparent fracture dislocation. No bone destruction.      Impression:        1. No evidence of acute bony process.    Meryle Ready, MD   04/04/2016 4:55 PM                  Signed by: Emily Filbert, MD   cc:Pcp, Kathreen Cosier, MD

## 2016-04-05 NOTE — Plan of Care (Signed)
Problem: Chronic Pain  Goal: Pain controlled to Patient's (Family's) desired goal  Outcome: Progressing  Upon arrival to ED, pt's pain had been 10/10.  Pt given dilauded in IV.  When pt arrived to unit, she stated that her pain was 5/10.  Pt states that he comfort goal is less than 4.  Pt states that she has been in severe pain for many weeks and that no medication has helped so far, except for what was given in the ED.  Pt has had many tests run to try to figure out what is causing her this severe R hip pain, but feels she has gotten nowhere. Pt is hoping that she will find a resolution during this admission.   Pt given another dose of IV dilauded before she went down to MRI. Pt seemed more comfortable upon arrival back to the unit, and was able to eat dinner.    Pt sleeping in bed at this time.  Oral dilauded now ordered q4hrs for moderate-severe pain. Will continue to monitor and assess pt.

## 2016-04-05 NOTE — Progress Notes (Signed)
Patient distressed, pacing, crying out.  Given pain medication, rounded with MD and discussed plan.  Waiting on neurosurgery to see patient.  Patient and husband are angry that neurosurgery has not been by to see them yet.  MD and RN assured patient they would be here as soon as they were able, that they had been notified.

## 2016-04-05 NOTE — Progress Notes (Signed)
At start of shift, pt complaining of 10/10 pain.  Pt given IV toradol with no relief.  Pt then given oral dilauded, pt was pacing the room and crying in pain asking for something more.  NP consulted, pt given 1 time dose of IV dilauded (1mg ).  Pt then able to rest in bed.    Will continue to monitor.

## 2016-04-05 NOTE — Plan of Care (Signed)
Problem: Health Promotion  Goal: Risk control - tobacco abuse  Actions to eliminate or reduce tobacco use.   Outcome: Completed Date Met:  04/05/16

## 2016-04-05 NOTE — Consults (Signed)
CONSULTATION    Date Time: 04/05/2016 1:03 PM  Patient Name: Paula Trujillo  Requesting Physician: Emily Filbert, DO      Reason for Consultation:   Right hip pain    Assessment:   Spasms of right, lateral hip pain, poorly controlled with oral analgesics.    Plan:   Dr Deloria Lair from neurosurgery to see patient today and make recommendations.   Could repeat transforaminal epidural steroid injections although they have not been helpful in the past.  Also, unsure if patient could tolerate without sedation (and she has just eaten lunch).    History:   Paula Trujillo is a 60 y.o. female who presents to the hospital on 04/04/2016 with worsening, progressive right hip pain.  The pain began suddenly four months ago and has been treated with what sound like two right sided transforaminal epidural  Steroid injections (? L5S1 and L1,2).  Each resulted in transient numbness and weakness (?local anesthetic) but no improvement in symptoms.  Pain is described as electric shock into lateral right hip - no known triggers and no radiation into leg.  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  She has had oral steroids on three occasions.  She had a good response to the first treatment, minimal response to the second and no response to the third (most recent).      Past Medical History:     Past Medical History   Diagnosis Date   . Lower back pain    . Neck pain        Past Surgical History:   History reviewed. No pertinent past surgical history.    Family History:     Family History   Problem Relation Age of Onset   . Cancer Sister        Social History:     Social History     Social History   . Marital Status: Married     Spouse Name: N/A   . Number of Children: N/A   . Years of Education: N/A     Social History Main Topics   . Smoking status: Never Smoker    . Smokeless tobacco: Not on file   . Alcohol  Use: No   . Drug Use: No   . Sexual Activity: Not on file     Other Topics Concern   . Not on file     Social History Narrative       Allergies:   No Known Allergies    Medications:     Current Facility-Administered Medications   Medication Dose Route Frequency   . cyclobenzaprine  10 mg Oral Q8H   . enoxaparin  40 mg Subcutaneous QHS   . gabapentin  300 mg Oral Q8H SCH   . HYDROmorphone  1 mg Intravenous Once   . HYDROmorphone  1 mg Intravenous Once   . valsartan-hydroCHLOROthiazide  160-12.5 (DIOVAN HCT) combo dose   Oral Daily       Review of Systems:       Physical Exam:   Awake, sedated but easily arousable and appropriate  Filed Vitals:    04/05/16 0726   BP: 123/67   Pulse: 80   Temp: 36.4 C (97.6 F)   Resp: 18   SpO2: 93%           Labs Reviewed:  Results     Procedure Component Value Units Date/Time    Hemoglobin A1C [[id number]] Collected:  04/05/16 0408    Specimen Information:  Blood Updated:  04/05/16 1245     Hemoglobin A1C 5.9 %      Average Estimated Glucose 122.6 mg/dL     Comprehensive metabolic panel [[id number]]  (Abnormal) Collected:  04/05/16 0408    Specimen Information:  Blood Updated:  04/05/16 0515     Glucose 95 mg/dL      BUN 78.2 mg/dL      Creatinine 0.8 mg/dL      Sodium 956 mEq/L      Potassium 3.8 mEq/L      Chloride 109 mEq/L      CO2 24 mEq/L      Calcium 8.8 mg/dL      Protein, Total 5.7 (L) g/dL      Albumin 2.8 (L) g/dL      AST (SGOT) 12 U/L      ALT 9 U/L      Alkaline Phosphatase 35 (L) U/L      Bilirubin, Total 0.3 mg/dL      Globulin 2.9 g/dL      Albumin/Globulin Ratio 1.0     CREATINE KINASE LEVEL (CK) [[id number]] Collected:  04/05/16 0408    Specimen Information:  Blood Updated:  04/05/16 0515     Creatine Kinase (CK) 51 U/L     GFR [[id number]] Collected:  04/05/16 0408     EGFR >60.0 Updated:  04/05/16 0515    CBC with diff [[id number]]  (Abnormal) Collected:  04/05/16 0408    Specimen Information:  Blood from Blood Updated:  04/05/16 0446     WBC 6.04 x10 3/uL       Hgb 9.1 (L) g/dL      Hematocrit 27.2 (L) %      Platelets 269 x10 3/uL      RBC 3.54 (L) x10 6/uL      MCV 81.1 fL      MCH 25.7 (L) pg      MCHC 31.7 (L) g/dL      RDW 15 %      MPV 9.6 fL      Neutrophils 62.7 %      Lymphocytes Automated 28.1 %      Monocytes 7.9 %      Eosinophils Automated 0.8 %      Basophils Automated 0.2 %      Immature Granulocyte 0.3 %      Nucleated RBC 0.0 /100 WBC      Neutrophils Absolute 3.78 x10 3/uL      Abs Lymph Automated 1.70 x10 3/uL      Abs Mono Automated 0.48 x10 3/uL      Abs Eos Automated 0.05 x10 3/uL      Absolute Baso Automated 0.01 x10 3/uL      Absolute Immature Granulocyte 0.02 x10 3/uL      Absolute NRBC 0.00 x10 3/uL     Sedimentation rate (ESR) [[id number]] Collected:  04/04/16 1616    Specimen Information:  Blood Updated:  04/04/16 1731     Sed Rate 20 mm/Hr     GFR [[id number]] Collected:  04/04/16 1616     EGFR >60.0 Updated:  04/04/16 1639    Basic Metabolic Panel [[id number]]  (Abnormal) Collected:  04/04/16 1616    Specimen Information:  Blood Updated:  04/04/16 1639     Glucose 107 (H) mg/dL  BUN 19.0 mg/dL      Creatinine 0.8 mg/dL      Calcium 8.9 mg/dL      Sodium 161 mEq/L      Potassium 4.1 mEq/L      Chloride 107 mEq/L      CO2 25 mEq/L     C Reactive Protein [[id number]] Collected:  04/04/16 1616    Specimen Information:  Blood Updated:  04/04/16 1639     C-Reactive Protein <0.1 mg/dL     CBC with differential [[id number]]  (Abnormal) Collected:  04/04/16 1616    Specimen Information:  Blood from Blood Updated:  04/04/16 1629     WBC 6.71 x10 3/uL      Hgb 9.8 (L) g/dL      Hematocrit 95.6 (L) %      Platelets 303 x10 3/uL      RBC 3.75 (L) x10 6/uL      MCV 81.6 fL      MCH 26.1 (L) pg      MCHC 32.0 g/dL      RDW 15 %      MPV 9.6 fL      Neutrophils 61.3 %      Lymphocytes Automated 31.0 %      Monocytes 6.6 %      Eosinophils Automated 0.7 %      Basophils Automated 0.1 %      Immature Granulocyte 0.3 %      Nucleated RBC 0.0 /100 WBC       Neutrophils Absolute 4.11 x10 3/uL      Abs Lymph Automated 2.08 x10 3/uL      Abs Mono Automated 0.44 x10 3/uL      Abs Eos Automated 0.05 x10 3/uL      Absolute Baso Automated 0.01 x10 3/uL      Absolute Immature Granulocyte 0.02 x10 3/uL      Absolute NRBC 0.00 x10 3/uL               Rads:     Lumbar MRI this admission:    L1-L2: There is mild disc space narrowing and a small disc bulge which  is slightly asymmetric towards the right. There is no central canal  stenosis. There is no foraminal narrowing. There is mild facet  arthropathy.     L2-L3: Normal disc signal and morphology. No central canal or foraminal  narrowing. There is mild facet arthropathy.     L3-L4: Normal disc signal and morphology. No central canal or foraminal  narrowing. There is mild facet arthropathy.         L4-L5: There is disc space narrowing and disc desiccation. There is a  diffuse disc bulge with bilateral foraminal extension as well as  extension into the lateral recess bilaterally.  There is contact of the  traversing L5 nerve roots bilaterally. There is facet arthropathy and  ligamentum flavum redundancy. There is no central canal stenosis. There  is mild right foraminal narrowing. The left foramen is patent.       L5-S1: There is a mild disc bulge with a superimposed right paracentral  disc protrusion best demonstrated on image 4, series 7 there is contact  of the traversing right S1 nerve root as well as posterior and leftward  displacement of the thecal sac. There is mild central canal stenosis.  There is mild bilateral foraminal narrowing. There is facet arthropathy.     Post contrast imaging demonstrates no abnormal enhancement.  IMPRESSION:        1.  Multilevel lumbar spondylosis most prominent at L4-L5 and L5-S1.     2.  At L4-L5 there is effacement of the lateral recess bilaterally and  contact of the traversing L5 nerve roots. There is mild right-sided  foraminal narrowing.     3.  At L5-S1 there is a small right  paracentral disc protrusion  contacting the traversing right S1 nerve root as well as resultant mass  effect upon the thecal sac.  There is mild central canal stenosis and  mild bilateral foraminal narrowing.     4.  Multilevel facet arthropathy.      Signed by: Chrissie Noa A Ander Wamser

## 2016-04-05 NOTE — PT Eval Note (Signed)
[facility]   Physical Therapy Evaluation   Patient: Paula Trujillo    MRN#: [id number]   Unit: [facility] TOWER 6  Bed: F639/F639.01    Discharge Recommendations:   Discharge Recommendation: Home with no needs, Home with outpatient PT (outpatient PT )   DME Recommendation: DME Recommended for Discharge:  (no needs)    Recommend Home with no needs, Home with outpatient PT (outpatient PT ) for discharge disposition.    Assessment:   Paula Trujillo is a 60 y.o. female admitted 04/04/2016.  Pt demonstrates ability to perform all mobility tasks independently and without safety concerns. Pt would greatly benefit from outpatient physical therapy for gait training, shoe insert consultation, strengthening, and improving her muscle flexibility. Pt shows uneven scapulae height, asymmetrical leg length, and glute med weakness, which is all likely contributing to her c/o R hip pain. Recommend outpatient physical therapy to address pt's deficits in order to decrease her pain and improve her quality of life.    Impairments: Assessment: Appears to be at baseline for mobility;Appears to be at baseline for balance.     Therapy Diagnosis: hip pain, back pain, muscle weakness, gait dysfunction    Rehabilitation Potential: Prognosis: Good;With continued PT status post acute discharge    Treatment Activities: PT eval, gait training, manual therapy  Educated the patient to role of physical therapy, plan of care, goals of therapy and safety with mobility and ADLs, energy conservation techniques, home safety.    Plan:   Treatment/Interventions: No skilled interventions needed at this time         Risks/Benefits/POC Discussed with Pt/Family: With patient/family        Precautions and Contraindications:        Consult received for [facility] for PT Evaluation and Treatment.  Patient's medical condition is appropriate for Physical therapy intervention at this time.    Medical Diagnosis: Lumbar radiculopathy  [M54.16]  Intractable pain [R52]  Pain of right hip joint [M25.551]      History of Present Illness:   Paula Trujillo is a 60 y.o. female admitted on 04/04/2016 with intractable pain to right hip, ?neuropathic pain given description and exam, not tender on exam, no pain with movement of right hip, NOT septic joint, clinically not bursitis, negative straight leg raise, slightly blunted strength in toes of right and reflexes in RLE, no bowel/bladder difficulties/incontinence, consulted ortho and nsgy, pain unrelieved in ED with narcotics and benzos, MRI L spine ordered, admitted for pain control and further evaluation.     Past Medical/Surgical History:  Past Medical History   Diagnosis Date   . Lower back pain    . Neck pain          X-Rays/Tests/Labs:  Hip Right Ap And Lateral With Pelvis    04/04/2016  1. No evidence of acute bony process. Meryle Ready, MD 04/04/2016 4:55 PM     Ct L- Spine Without Contrast    04/04/2016   1. No lumbar spine fracture is detected. 2. Mild degenerative changes of the lumbar spine most prominent at L1-L2 and L4-L5. Neldon Mc, MD 04/04/2016 9:41 PM     Mri L-spine With/without Contrast    04/05/2016   1.  Multilevel lumbar spondylosis most prominent at L4-L5 and L5-S1. 2.  At L4-L5 there is effacement of the lateral recess bilaterally and contact of the traversing L5 nerve roots. There is mild right-sided foraminal narrowing. 3.  At L5-S1 there is  a small right paracentral disc protrusion contacting the traversing right S1 nerve root as well as resultant mass effect upon the thecal sac.  There is mild central canal stenosis and mild bilateral foraminal narrowing. 4.  Multilevel facet arthropathy. Neldon Mc, MD 04/05/2016 8:38 AM        Social History:   Prior Level of Function:  Prior level of function: Independent with ADLs, Ambulates independently  Baseline Activity Level: Community ambulation  DME Currently at Home:  (none)    Home Living Arrangements:  Living Arrangements:  Spouse/significant other  Type of Home: House  Home Layout: Multi-level, Stairs to enter with rails (add number in comment)  DME Currently at Home:  (none)    Subjective:   Patient is agreeable to participation in the therapy session. Nursing clears patient for therapy. "I've had hip pain since February of this year."     Patient Goal: "for my hip pain to decrease"    Pain Assessment  Pain Assessment: Numeric Scale (0-10)  Pain Score: 10-severe pain  Pain Location: Hip  Pain Orientation: Right  Pain Descriptors: Discomfort;Aching  Pain Frequency: Continuous  Pain Intervention(s): Medication (See eMAR) (pre-medicated prior to session)    Objective:   Observation of Patient/Vital Signs:  Patient is in bed with no medical equipment in place.    Observation of Patient/Vital signs:       Cognition/Neuro Status  Orientation Level: Oriented X4        Musculoskeletal Examination:  Gross ROM  Right Upper Extremity ROM: within functional limits  Left Upper Extremity ROM: within functional limits  Right Lower Extremity ROM: within functional limits  Left Lower Extremity ROM: within functional limits    Gross Strength  Right Upper Extremity Strength: 4/5  Left Upper Extremity Strength: 4/5  Right Lower Extremity Strength:  (hip flex, knee flex/ext, ankle DF 4/5)  Left Lower Extremity Strength:  (hip flex, knee flex/ext, ankle DF 4/5)         Functional Mobility:  Supine to Sit: Independent  Sit to Supine: Independent  Sit to Stand: Independent  Stand to Sit: Independent    Transfers  Bed to Chair: Independent (bed to/from bed)  Device Used for Functional Transfer:  (none)    Ambulation:  Ambulation: Independent  Ambulation Distance (Feet): 300 Feet  Pattern: compensated trendelenburg;R foot decreased clearance;L foot decreased clearance;decreased step length;decreased cadence  Educated pt on increasing glute med activation during ambulation. Advised pt to only wear flat shoes versus high heels. Educated pt on resulting leg  length difference due to scoliosis. Advised pt to use shoe insert to create symmetrical leg length in standing.   Stair Management: Independent;one rail R;alternating pattern  Number of Stairs: 8     Balance:  Balance: within functional limits         Participation and Activity Tolerance:  Participation Effort: excellent      Patient left with call bell within reach, all needs met, SCDs off (none in room) , fall mat in place, bed alarm n/a, chair alarm n/a and all questions answered. RN notified of session outcome and patient response.     Therex:  Educated pt on performing soft tissue mobility  using tennis ball to glute region while standing against a firm surface  Reviewed form for glute med and glute max stretches  Educated pt on performing R glute med activation during R sided single leg stance in order to decrease irritation to bursa. Pt performed 8 reps of 5 second  hold of R glute med activation in single leg stance.            Caprice Beaver PT, DPT      Time of treatment:   PT Received On: 04/05/16  Start Time: 0850  Stop Time: 0926  Time Calculation (min): 36 min

## 2016-04-05 NOTE — Plan of Care (Signed)
Problem: Chronic Pain  Goal: Pain controlled to Patient's (Family's) desired goal  Outcome: Not Progressing  Pain not controlled, given dilaudid, oral and IV.  Attempting to control pain and discomfort with cyclobenzaprine, heat/ice not helping per pt.  Pain management consult ordered and neurosurgery consulted.

## 2016-04-05 NOTE — Consults (Signed)
NEUROSURGERY CONSULTATION    Date Time: 04/05/2016 10:26 PM  Patient Name: Paula Trujillo  Requesting Physician: Emily Filbert, DO  Consulting Physician:Dr. Rosemarie Beath   Covered By: Neurosurgery PA/pager [phone]  Time of Exam:2200        Reason for Consultation:   Intractable right hip pain with mild lumbar disc degeneration    History:   Paula Trujillo is a 60 y.o. female who presents to the hospital on 04/04/2016 with intractable right hip pain, progressive over the past year, patient is now reporting paroxysmal "shock" type pain into her right hip multiple times per hour, every hour of the day, disrupting sleep and physical activity. Pain medications and oral steroid therapy have been ineffectual. Patient denies weakness, numbness/tingling, pain in other areas of her body. Denies headache, dizziness, nausea/vomiting, vision/hearing changes, chest pain, abdominal pain, shortness of breath.     Past Medical History:     Past Medical History   Diagnosis Date   . Lower back pain    . Neck pain        Past Surgical History:   History reviewed. No pertinent past surgical history.    Family History:     Family History   Problem Relation Age of Onset   . Cancer Sister        Social History:     Social History     Social History   . Marital Status: Married     Spouse Name: N/A   . Number of Children: N/A   . Years of Education: N/A     Social History Main Topics   . Smoking status: Never Smoker    . Smokeless tobacco: Not on file   . Alcohol Use: No   . Drug Use: No   . Sexual Activity: Not on file     Other Topics Concern   . Not on file     Social History Narrative       Allergies:   No Known Allergies    Medications:     Current Facility-Administered Medications   Medication Dose Route Frequency   . cyclobenzaprine  10 mg Oral Q8H   . enoxaparin  40 mg Subcutaneous QHS   . gabapentin  300 mg Oral Q8H SCH   . HYDROmorphone  1 mg Intravenous Once   . valsartan-hydroCHLOROthiazide  160-12.5 (DIOVAN HCT) combo dose    Oral Daily       Review of Systems:   A comprehensive review of systems was: Negative except as noted in HPI All other systems were reviewed and are negative    Physical Exam:     Filed Vitals:    04/05/16 1957   BP: 111/63   Pulse: 93   Temp: 98.8 F (37.1 C)   Resp: 16   SpO2: 99%       Intake and Output Summary (Last 24 hours) at Date Time  No intake or output data in the 24 hours ending 04/05/16 2226  General Appearance: well appearing adult female, no obvious acute distress    Physical Exam:  Heart: RRR  Lungs: CTA  Abdomen: SNT    Awake alert oriented x 3  GCS: 15 E: 4 V: 4 M: 6  Speech is clear and fluent   PERRL 4 mm EOM: intact, face: symmetrical, Tongue: midline   CN II-XII: grossly intact  Strength/Motor function:   LUE  Delt: 5/5 Bi: 5/5 Tri: 5/5 Wfl: 5/5 Wex: 5/5 Grip: 5/5  RUE  Delt: 5/5 Bi: 5/5 Tri: 5/5 Wfl: 5/5 Wex: 5/5 Grip: 5/5  LLE  Psoas: 5/5 Quad: 5/5 Lfl: 5/5 Lex: 5/5 ADF: 5/5 APF: 5/5 EHL: 5/5  RLE  Psoas: 5/5 Quad: 5/5 Lfl: 5/5 Lex: 5/5 ADF: 5/5 APF: 5/5 EHL: 5/5  Sensation to light touch: intact  DTRs:   Left: Bic: 2+ Pat: 2+ Ankle: 2+  Right: Bic: 2+ Pat: 2+ Ankle: 2+  Hoffmann's: negative  Clonus: negative    Labs Reviewed:     Lab Results   Component Value Date    WBC 6.04 04/05/2016    HGB 9.1* 04/05/2016    HCT 28.7* 04/05/2016    MCV 81.1 04/05/2016    PLT 269 04/05/2016     Lab Results   Component Value Date    NA 140 04/05/2016    K 3.8 04/05/2016    CL 109 04/05/2016    CO2 24 04/05/2016     No results found for: INR, PT    Rads:     Radiology Results (24 Hour)     Procedure Component Value Units Date/Time    MRI L-spine with/without Contrast [[id number]] Collected:  04/05/16 0815    Order Status:  Completed Updated:  04/05/16 0842    Narrative:      CLINICAL HISTORY: 60 year old female with right hip and right lower  extremity pain.    TECHNIQUE: On a 3 Tesla system, the lumbar spine was imaged utilizing T1  and T2-weighted images in orthogonal planes before and after  the  administration of intravenous contrast.  6 cc of Gadavist was  administered.    Comparison is made to CT of the lumbar spine dated 04/04/2016.    FINDINGS:  Please note that multiple sequences are degraded secondary to patient  motion.    The lumbar spine is normally aligned. The vertebral body heights are  maintained. The marrow signal is unremarkable. The conus medullaris is  normal in appearance and terminates behind the L1 vertebral body.  Individual levels are as follows:    L1-L2: There is mild disc space narrowing and a small disc bulge which  is slightly asymmetric towards the right. There is no central canal  stenosis. There is no foraminal narrowing. There is mild facet  arthropathy.    L2-L3: Normal disc signal and morphology. No central canal or foraminal  narrowing. There is mild facet arthropathy.    L3-L4: Normal disc signal and morphology. No central canal or foraminal  narrowing. There is mild facet arthropathy.       L4-L5: There is disc space narrowing and disc desiccation. There is a  diffuse disc bulge with bilateral foraminal extension as well as  extension into the lateral recess bilaterally.  There is contact of the  traversing L5 nerve roots bilaterally. There is facet arthropathy and  ligamentum flavum redundancy. There is no central canal stenosis. There  is mild right foraminal narrowing. The left foramen is patent.     L5-S1: There is a mild disc bulge with a superimposed right paracentral  disc protrusion best demonstrated on image 4, series 7 there is contact  of the traversing right S1 nerve root as well as posterior and leftward  displacement of the thecal sac. There is mild central canal stenosis.  There is mild bilateral foraminal narrowing. There is facet arthropathy.    Post contrast imaging demonstrates no abnormal enhancement.      Impression:         1.  Multilevel lumbar  spondylosis most prominent at L4-L5 and L5-S1.    2.  At L4-L5 there is effacement of the lateral  recess bilaterally and  contact of the traversing L5 nerve roots. There is mild right-sided  foraminal narrowing.    3.  At L5-S1 there is a small right paracentral disc protrusion  contacting the traversing right S1 nerve root as well as resultant mass  effect upon the thecal sac.  There is mild central canal stenosis and  mild bilateral foraminal narrowing.    4.  Multilevel facet arthropathy.    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 Luan Pulling  Recommend L4/L5 ESI with Anesthesia  NSGY will continue to follow  No acute Neurosurgical intervention indicated at this time    Plan discussed with and developed by Dr. Deloria Lair    Signed by: Cecilio Asper, PA-C

## 2016-04-05 NOTE — Progress Notes (Signed)
Pt arrived from ED, alert and oriented.  Pt states her pain is 5/10 at this time- but is increasing.  Pt oriented to room, fall safety prevention sheet signed.

## 2016-04-06 LAB — CBC AND DIFFERENTIAL
Absolute NRBC: 0 10*3/uL
Basophils Absolute Automated: 0.02 10*3/uL (ref 0.00–0.20)
Basophils Automated: 0.3 %
Eosinophils Absolute Automated: 0.06 10*3/uL (ref 0.00–0.70)
Eosinophils Automated: 0.8 %
Hematocrit: 28.3 % — ABNORMAL LOW (ref 37.0–47.0)
Hgb: 8.8 g/dL — ABNORMAL LOW (ref 12.0–16.0)
Immature Granulocytes Absolute: 0.02 10*3/uL
Immature Granulocytes: 0.3 %
Lymphocytes Absolute Automated: 1.2 10*3/uL (ref 0.50–4.40)
Lymphocytes Automated: 15.1 %
MCH: 25.4 pg — ABNORMAL LOW (ref 28.0–32.0)
MCHC: 31.1 g/dL — ABNORMAL LOW (ref 32.0–36.0)
MCV: 81.8 fL (ref 80.0–100.0)
MPV: 9.3 fL — ABNORMAL LOW (ref 9.4–12.3)
Monocytes Absolute Automated: 0.9 10*3/uL (ref 0.00–1.20)
Monocytes: 11.3 %
Neutrophils Absolute: 5.74 10*3/uL (ref 1.80–8.10)
Neutrophils: 72.2 %
Nucleated RBC: 0 /100 WBC (ref 0.0–1.0)
Platelets: 227 10*3/uL (ref 140–400)
RBC: 3.46 10*6/uL — ABNORMAL LOW (ref 4.20–5.40)
RDW: 15 % (ref 12–15)
WBC: 7.94 10*3/uL (ref 3.50–10.80)

## 2016-04-06 LAB — BASIC METABOLIC PANEL
BUN: 25 mg/dL — ABNORMAL HIGH (ref 7.0–19.0)
CO2: 23 mEq/L (ref 22–29)
Calcium: 8 mg/dL — ABNORMAL LOW (ref 8.5–10.5)
Chloride: 105 mEq/L (ref 100–111)
Creatinine: 0.8 mg/dL (ref 0.6–1.0)
Glucose: 113 mg/dL — ABNORMAL HIGH (ref 70–100)
Potassium: 4.5 mEq/L (ref 3.5–5.1)
Sodium: 136 mEq/L (ref 136–145)

## 2016-04-06 LAB — GFR: EGFR: >60

## 2016-04-06 MED ORDER — LIDOCAINE 5 % EX PTCH
1.0000 | MEDICATED_PATCH | CUTANEOUS | Status: DC
Start: 2016-04-06 — End: 2016-04-10
  Administered 2016-04-06 – 2016-04-09 (×3): 1 via TRANSDERMAL
  Filled 2016-04-06 (×6): qty 1

## 2016-04-06 MED ORDER — PREDNISONE 20 MG PO TABS
40.0000 mg | ORAL_TABLET | Freq: Every day | ORAL | Status: DC
Start: 2016-04-06 — End: 2016-04-08
  Administered 2016-04-06 – 2016-04-08 (×3): 40 mg via ORAL
  Filled 2016-04-06 (×4): qty 2

## 2016-04-06 NOTE — Plan of Care (Signed)
Problem: Safety  Goal: Patient will be free from injury during hospitalization  Outcome: Progressing  Bed in lowest position, wheels locked. Falls mat in place. Call light/bedside table within reach.     Problem: Pain  Goal: Patient's pain/discomfort is manageable  Outcome: Progressing  Prn pain meds held as pt hypotensive. MD notified of pt's request to follow thru with neurosurgery/anesthesia rec's for [facility]- awaiting mds call. Will monitor BP    Pt resting comfortably with eyes closed- will awake from spasms/jerking motions from RLE/ R hip. Husband at bedside actively participating in care. Will cont to monitor.

## 2016-04-06 NOTE — Progress Notes (Signed)
NEUROSURGERY PROGRESS NOTE    Date Time: 04/06/2016 8:03 PM  Patient Name: Paula Trujillo  Consulting Attending Physician: Dr. Rosemarie Beath  Covered By: Neurosurgery PA/pager [phone]    Interim History:   Patient's family report she has gotten some relief with pain management and fell asleep about an hour prior to our arrival    Medications:     Current Facility-Administered Medications   Medication Dose Route Frequency   . cyclobenzaprine  10 mg Oral Q8H   . enoxaparin  40 mg Subcutaneous QHS   . gabapentin  300 mg Oral Q8H SCH   . HYDROmorphone  1 mg Intravenous Once   . lidocaine  1 patch Transdermal Q24H   . predniSONE  40 mg Oral Daily   . valsartan-hydroCHLOROthiazide  160-12.5 (DIOVAN HCT) combo dose   Oral Daily         Physical Exam:     Filed Vitals:    04/06/16 1050   BP: 109/59   Pulse: 97   Temp:    Resp:    SpO2:        Intake and Output Summary (Last 24 hours) at Date Time  No intake or output data in the 24 hours ending 04/06/16 2003    Neuro exam:   Patient sleeping, not woken for exam      Labs:     Lab Results   Component Value Date    WBC 7.94 04/06/2016    HGB 8.8* 04/06/2016    HCT 28.3* 04/06/2016    MCV 81.8 04/06/2016    PLT 227 04/06/2016     Lab Results   Component Value Date    NA 136 04/06/2016    K 4.5 04/06/2016    CL 105 04/06/2016    CO2 23 04/06/2016     No results found for: INR, PT    Rads:     Radiology Results (24 Hour)     ** No results found for the last 24 hours. 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 Luan Pulling  Planning for L4/L5 ESI with Anesthesia on Monday  NSGY will continue to follow  No acute Neurosurgical intervention indicated at this time    Plan discussed with and developed by Dr. Deloria Lair      Signed by: Cecilio Asper PA-C  Date/Time: 04/06/2016 8:03 PM     I have reviewed the notes and examined the patient with and performed by PA Perry Mount , I concur with her/his documentation of MYKA HITZ.    Dr. Marlaine Hind

## 2016-04-06 NOTE — Plan of Care (Signed)
Problem: Health Promotion  Goal: Vaccination Screening  All patients will be screened for current vaccination status on each admission.   Outcome: Completed Date Met:  04/06/16  Goal: Knowledge - disease process  Extent of understanding conveyed about a specific disease process.   Outcome: Progressing  Goal: Knowledge - health resources  Extent of understanding and conveyed about healthcare resources.   Outcome: Progressing    Problem: Safety  Goal: Patient will be free from injury during hospitalization  Outcome: Progressing    Problem: Pain  Goal: Patient's pain/discomfort is manageable  Outcome: Not Progressing    Problem: Psychosocial and Spiritual Needs  Goal: Demonstrates ability to cope with hospitalization/illness  Outcome: Progressing    Problem: Moderate/High Fall Risk Score >5  Goal: Patient will remain free of falls  Outcome: Progressing    Problem: Chronic Pain  Goal: Pain controlled to Patient's (Family's) desired goal  Outcome: Not Progressing  Pt states that nothing is helping her pain.  Though at times, pt is able to rest and fall asleep in bed.  Pain charted on and frequently assessed.  PRN pain meds given when available and requested.  Multiple specialties following. Pain management consult ordered.

## 2016-04-06 NOTE — Progress Notes (Signed)
Pt a&o x3, drowsy at times but easily arousable. Prn held due to drowsiness. Pt and husband educated on prn and scheduled medications and reasoning for holding meds d/t resp depression/drowsiness/hypotension. Heat pad at bedside-educated not to place where patch is. Lidoderm patch placed on R hip.     Plan for selective nerve root block on Monday per anesthesia.     No complaints at this time. Will cont to monitor.

## 2016-04-06 NOTE — UM Notes (Signed)
CSR for 04/06/16    Pain 6-7/10  toradol IV x 1  lidoderm patch  Heating pad    toradol IV dced    Prednisone po    BUN: 25    Anesthesia note: Pt would like to havea selective nerve root block performed. We do not have the capability to perform this on the weekend. It can be arranged for this Monday as an inpatient or can be scheduled at the pain clinic as an out patient. Will follow up. Continue medical management in the mean time.    Medicine note: will start a trial of oral Prednisone, Lidoderm patch, ambulate as tolerated.   If pain not improving, nerve block on Monday     Leafy Half RN BSN  Utilization Review Case Manager  [employer]  [phone]

## 2016-04-06 NOTE — Progress Notes (Signed)
Pt visited today.  She would like to have  A selective nerve root block performed.  We do not have the capability to perform this on the weekend.  It can be arranged for this Monday as an inpatient or can be scheduled at the pain clinic as an out patient.  Will follow up.  Continue medical management in the mean time.

## 2016-04-06 NOTE — Progress Notes (Signed)
MEDICINE PROGRESS NOTE    Date Time: 04/06/2016 5:45 PM  Patient Name: Paula Trujillo, Paula Trujillo  GEX:[id number]  [id number]  Attending Physician: Lavonna Rua, MD  CC: Intractable right hip pain due to L5-S1 disc herniation and degenerative disc disease  Interval History/24 hour events: She continues to reports severe pain in and right hip.Schedule for selective nerve root block on Monday.  Principal Problem:    Intractable pain  Active Problems:    Intractable right hip pain    Lumbar nerve root impingement  Resolved Problems:    * No resolved hospital problems. *    Assessment:   Intractable right hip pain.  L5-S1 disc herniation.  Degenerative disc disease.  History of gastric bypass  Chronic pelvic pain-status post hysterectomy.  Hypertension.  Plan:   Evaluated by neurosurgery.  Evaluated by anesthesiology.  Schedule for selective nerve root block on Monday  Continue pain management  Started on prednisone 40 mg daily.  Started on Lidoderm patch.  Provide supportive care.  Continue cardiac monitor.  Fall and safety precautions  Morning lab work  Continue deep vein thrombosis prophylaxis.  Discharge planning when medically stable  Please see attending note that follows this mid-level encounter note.   Review of Systems:   Review of Systems - Respiratory ROS: negative for - cough, shortness of breath or wheezing  Cardiovascular ROS: negative for - chest pain, edema, palpitations or shortness of breath  Gastrointestinal ROS: negative for - abdominal pain, constipation, diarrhea or nausea/vomiting  Neurological ROS: positive for - weakness  negative for - confusion, dizziness, numbness/tingling, speech problems or visual changes  Musc: positive for right hip pain  Physical Exam:   BP 109/59 mmHg  Pulse 97  Temp(Src) 99.2 F (37.3 C) (Oral)  Resp 14  Ht 1.702 m (5\' 7" )  Wt 60.328 kg (133 lb)  BMI 20.83 kg/m2  SpO2 96%  Body mass index is 20.83 kg/(m^2).  No intake or output data in the 24 hours ending 04/06/16  1745  General: awake, alert, oriented x 3; no acute distress.  Cardiovascular: regular rate and rhythm, no murmurs, rubs or gallops  Lungs: clear to auscultation bilaterally, without wheezing, rhonchi, or rales  Abdomen: soft, non-tender, non-distended; no palpable masses, no hepatosplenomegaly, normoactive bowel sounds, no rebound or guarding  Extremities: no clubbing, cyanosis, or edema    Meds:     Current Facility-Administered Medications   Medication Dose Route Frequency   . cyclobenzaprine  10 mg Oral Q8H   . enoxaparin  40 mg Subcutaneous QHS   . gabapentin  300 mg Oral Q8H SCH   . HYDROmorphone  1 mg Intravenous Once   . lidocaine  1 patch Transdermal Q24H   . predniSONE  40 mg Oral Daily   . valsartan-hydroCHLOROthiazide  160-12.5 (DIOVAN HCT) combo dose   Oral Daily     Labs:     Recent Labs      04/06/16   0337  04/05/16   0408   WBC  7.94  6.04   HGB  8.8*  9.1*   HEMATOCRIT  28.3*  28.7*   PLATELETS  227  269   MCV  81.8  81.1     Recent Labs      04/06/16   0337  04/05/16   0408   SODIUM  136  140   POTASSIUM  4.5  3.8   CHLORIDE  105  109   CO2  23  24   BUN  25.0*  16.0  CREATININE  0.8  0.8   GLUCOSE  113*  95   CALCIUM  8.0*  8.8     Recent Labs      04/05/16   0408   AST (SGOT)  12   ALT  9   ALKALINE PHOSPHATASE  35*   PROTEIN, TOTAL  5.7*   ALBUMIN  2.8*     No results for input(s): PTT, PT, INR in the last 72 hours.  Imaging personally reviewed, including:     Safety Checklist  DVT prophylaxis:  CHEST guideline (See page [phone]) Chemical   Foley:  [place] Rn Foley protocol Not present   IVs:  None   PT/OT: Not needed   Daily CBC & or Chem ordered:  SHM/ABIM guidelines (see #5) Yes, due to clinical and lab instability     Disposition:   Today's date: 04/06/2016  Admit Date: 04/04/2016  3:05 PM  Anticipated medical stability for discharge:Red - not tomorrow - estimated month/date: 04/08/16  Service status: Observation: Due to Intractable right hip pain.  Reason for ongoing hospitalization: Persistent  severe right hip pain .Need selective nerve root block on Monday  Anticipated discharge needs: TBD  I have discussed with Attending: Dr. Lavonna Rua  Signed by: Scharlene Corn, DNP, NP  Adult Observation Unit Nurse Practitioner  [phone] or [phone]  I have Reviewed the interval history, images and pertinent test results and personally examined the patient and confirmed the major physical findings of the preceding mid-levels note. Agree with above

## 2016-04-07 DIAGNOSIS — M5416 Radiculopathy, lumbar region: Secondary | ICD-10-CM | POA: Diagnosis present

## 2016-04-07 LAB — CBC AND DIFFERENTIAL
Absolute NRBC: 0 10*3/uL
Basophils Absolute Automated: 0 10*3/uL (ref 0.00–0.20)
Basophils Automated: 0 %
Eosinophils Absolute Automated: 0 10*3/uL (ref 0.00–0.70)
Eosinophils Automated: 0 %
Hematocrit: 28.8 % — ABNORMAL LOW (ref 37.0–47.0)
Hgb: 9.2 g/dL — ABNORMAL LOW (ref 12.0–16.0)
Immature Granulocytes Absolute: 0.02 10*3/uL
Immature Granulocytes: 0.3 %
Lymphocytes Absolute Automated: 0.86 10*3/uL (ref 0.50–4.40)
Lymphocytes Automated: 12 %
MCH: 25.7 pg — ABNORMAL LOW (ref 28.0–32.0)
MCHC: 31.9 g/dL — ABNORMAL LOW (ref 32.0–36.0)
MCV: 80.4 fL (ref 80.0–100.0)
MPV: 9.8 fL (ref 9.4–12.3)
Monocytes Absolute Automated: 0.48 10*3/uL (ref 0.00–1.20)
Monocytes: 6.7 %
Neutrophils Absolute: 5.81 10*3/uL (ref 1.80–8.10)
Neutrophils: 81 %
Nucleated RBC: 0 /100 WBC (ref 0.0–1.0)
Platelets: 235 10*3/uL (ref 140–400)
RBC: 3.58 10*6/uL — ABNORMAL LOW (ref 4.20–5.40)
RDW: 15 % (ref 12–15)
WBC: 7.17 10*3/uL (ref 3.50–10.80)

## 2016-04-07 LAB — BASIC METABOLIC PANEL
BUN: 20 mg/dL — ABNORMAL HIGH (ref 7.0–19.0)
CO2: 24 mEq/L (ref 22–29)
Calcium: 8.1 mg/dL — ABNORMAL LOW (ref 8.5–10.5)
Chloride: 108 mEq/L (ref 100–111)
Creatinine: 0.7 mg/dL (ref 0.6–1.0)
Glucose: 107 mg/dL — ABNORMAL HIGH (ref 70–100)
Potassium: 4.1 mEq/L (ref 3.5–5.1)
Sodium: 138 mEq/L (ref 136–145)

## 2016-04-07 LAB — GFR: EGFR: >60

## 2016-04-07 MED ORDER — OXYCODONE-ACETAMINOPHEN 5-325 MG PO TABS
1.0000 | ORAL_TABLET | ORAL | Status: DC | PRN
Start: 2016-04-07 — End: 2016-04-09
  Filled 2016-04-07: qty 1

## 2016-04-07 NOTE — Plan of Care (Signed)
Problem: Pain  Goal: Patient's pain/discomfort is manageable  Outcome: Progressing  Pt more alert this AM, c/o right hip pain, 10/10, given PRN PO Dilaudid, will reassess

## 2016-04-07 NOTE — Plan of Care (Signed)
Problem: Safety  Goal: Patient will be free from injury during hospitalization  Outcome: Progressing  Pt is a moderate fall risk.  Siderails up x2, bed locked and in low position, floor mat in place but only one per patient request, and callbell within reach.  Pt verbalized understanding of how to use callbell. Pt informed of importance of ambulating with a nurse. Will continue to monitor.

## 2016-04-07 NOTE — Progress Notes (Signed)
[facility] HOSPITALIST PROGRESS NOTE    Date Time: 04/07/2016 1:20 PM  Patient Name: Paula Trujillo, Paula Trujillo  ZOX:[id number]  [id number]  Attending Physician: Lavonna Rua, MD  CC: Intractable right hip pain due to L5-S1 disc herniation and degenerative disc disease  Interval History/24 hour events: She continues to reports severe pain in and right hip. Schedule for selective nerve root block on Monday.  Feels steroids migh thave helped but gets sudden excruciating pain.     Principal Problem:    Intractable pain  Active Problems:    Intractable right hip pain    Lumbar nerve root impingement    Lumbar radiculopathy  Resolved Problems:    * No resolved hospital problems. *    Assessment:   Intractable right hip pain.  L5-S1 disc herniation.  Degenerative disc disease.  History of gastric bypass  Chronic pelvic pain-status post hysterectomy.  Hypertension.    Plan:   Evaluated by neurosurgery.  Evaluated by anesthesiology.  Schedule for selective nerve root block on Monday  Continue pain management  Started on prednisone 40 mg daily.  Started on Lidoderm patch.  Provide supportive care.  Continue cardiac monitor.  Fall and safety precautions  Morning lab work  Continue deep vein thrombosis prophylaxis.  Discharge planning when medically stable, may be tomorrow after the injection.       Review of Systems:   Review of Systems - Respiratory ROS: negative for - cough, shortness of breath or wheezing  Cardiovascular ROS: negative for - chest pain, edema, palpitations or shortness of breath  Gastrointestinal ROS: negative for - abdominal pain, constipation, diarrhea or nausea/vomiting  Neurological ROS: positive for - weakness  negative for - confusion, dizziness, numbness/tingling, speech problems or visual changes  Musc: positive for right hip pain  Physical Exam:   BP 106/67 mmHg  Pulse 92  Temp(Src) 97.3 F (36.3 C) (Oral)  Resp 18  Ht 1.702 m (5\' 7" )  Wt 60.328 kg (133 lb)  BMI 20.83 kg/m2  SpO2 98%  Body mass index is  20.83 kg/(m^2).  No intake or output data in the 24 hours ending 04/07/16 1320  General: awake, alert, oriented x 3; no acute distress.  Cardiovascular: regular rate and rhythm, no murmurs, rubs or gallops  Lungs: clear to auscultation bilaterally, without wheezing, rhonchi, or rales  Abdomen: soft, non-tender, non-distended; no palpable masses, no hepatosplenomegaly, normoactive bowel sounds, no rebound or guarding  Extremities: no clubbing, cyanosis, or edema    Meds:     Current Facility-Administered Medications   Medication Dose Route Frequency   . cyclobenzaprine  10 mg Oral Q8H   . enoxaparin  40 mg Subcutaneous QHS   . gabapentin  300 mg Oral Q8H SCH   . HYDROmorphone  1 mg Intravenous Once   . lidocaine  1 patch Transdermal Q24H   . predniSONE  40 mg Oral Daily   . valsartan-hydroCHLOROthiazide  160-12.5 (DIOVAN HCT) combo dose   Oral Daily     Labs:     Recent Labs      04/07/16   0420  04/06/16   0337   WBC  7.17  7.94   HGB  9.2*  8.8*   HEMATOCRIT  28.8*  28.3*   PLATELETS  235  227   MCV  80.4  81.8     Recent Labs      04/07/16   0419  04/06/16   0337   SODIUM  138  136   POTASSIUM  4.1  4.5  CHLORIDE  108  105   CO2  24  23   BUN  20.0*  25.0*   CREATININE  0.7  0.8   GLUCOSE  107*  113*   CALCIUM  8.1*  8.0*     Recent Labs      04/05/16   0408   AST (SGOT)  12   ALT  9   ALKALINE PHOSPHATASE  35*   PROTEIN, TOTAL  5.7*   ALBUMIN  2.8*     No results for input(s): PTT, PT, INR in the last 72 hours.  Imaging personally reviewed, including:     Safety Checklist  DVT prophylaxis:  CHEST guideline (See page [phone]) Chemical   Foley:  [place] Rn Foley protocol Not present   IVs:  None   PT/OT: Not needed   Daily CBC & or Chem ordered:  SHM/ABIM guidelines (see #5) Yes, due to clinical and lab instability     Disposition:   Today's date: 04/07/2016  Admit Date: 04/04/2016  3:05 PM  Anticipated medical stability for discharge:Red - not tomorrow - estimated month/date: 04/08/16  Service status: Observation: Due  to Intractable right hip pain.  Reason for ongoing hospitalization: Persistent severe right hip pain .Need selective nerve root block on Monday  Anticipated discharge needs: TBD    Signed by: Lavonna Rua, MD

## 2016-04-07 NOTE — UM Notes (Signed)
PT CHANGED FROM OBS TO INPATIENT ADM STATUS.      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 Luan Pulling  Planning for L4/L5 ESI with Anesthesia on Monday  NSGY will continue to follow  No acute Neurosurgical intervention indicated at this time      MOST RECENT VS:  97.3, 92, 18,  106/67.  98% sats

## 2016-04-07 NOTE — Progress Notes (Signed)
Upon assessment pt is aaox4, IV intact, moderate fall precautions maintained, plan of care discussed and all questions answered at this time. Lidocaine patch in place and prn pain medications given.  Will continue to monitor.

## 2016-04-07 NOTE — Progress Notes (Signed)
Pain service will coordinate with the primary service and interventional Pain physicians/OR availability and schedule an esi for this patient.   Please hold the patients lovenox tonite so that the patient can have her epidural steroid block performed safely.

## 2016-04-07 NOTE — Plan of Care (Addendum)
Problem: Safety  Goal: Patient will be free from injury during hospitalization  Outcome: Progressing    Problem: Pain  Goal: Patient's pain/discomfort is manageable  Outcome: Progressing    Problem: Chronic Pain  Goal: Pain controlled to Patient's (Family's) desired goal  Outcome: Progressing    Comments:   Pt drowsy, but easily arousable, oriented X3. Pt right hip pain managed with scheduled Flexeril, Gabapentin and Lidocaine to area, denies any breakthrough pain. Parents at bedside. Fall and safety precautions in place

## 2016-04-08 ENCOUNTER — Inpatient Hospital Stay: Payer: BLUE CROSS/BLUE SHIELD | Admitting: Anesthesiology

## 2016-04-08 ENCOUNTER — Encounter: Payer: Self-pay | Admitting: Anesthesiology

## 2016-04-08 ENCOUNTER — Inpatient Hospital Stay: Payer: BLUE CROSS/BLUE SHIELD

## 2016-04-08 ENCOUNTER — Encounter
Admission: EM | Disposition: A | Discharge status: Home / Self Care | Payer: Self-pay | Source: Home / Self Care | Attending: Hospitalist

## 2016-04-08 HISTORY — PX: INJECTION, EPIDURAL LUMBAR: SHX4305

## 2016-04-08 LAB — BASIC METABOLIC PANEL
BUN: 19 mg/dL (ref 7.0–19.0)
CO2: 21 mEq/L — ABNORMAL LOW (ref 22–29)
Calcium: 8.2 mg/dL — ABNORMAL LOW (ref 8.5–10.5)
Chloride: 107 mEq/L (ref 100–111)
Creatinine: 0.8 mg/dL (ref 0.6–1.0)
Glucose: 94 mg/dL (ref 70–100)
Potassium: 4.1 mEq/L (ref 3.5–5.1)
Sodium: 136 mEq/L (ref 136–145)

## 2016-04-08 LAB — CBC AND DIFFERENTIAL
Absolute NRBC: 0 10*3/uL
Basophils Absolute Automated: 0.01 10*3/uL (ref 0.00–0.20)
Basophils Automated: 0.1 %
Eosinophils Absolute Automated: 0.04 10*3/uL (ref 0.00–0.70)
Eosinophils Automated: 0.5 %
Hematocrit: 27 % — ABNORMAL LOW (ref 37.0–47.0)
Hgb: 8.5 g/dL — ABNORMAL LOW (ref 12.0–16.0)
Immature Granulocytes Absolute: 0.03 10*3/uL
Immature Granulocytes: 0.4 %
Lymphocytes Absolute Automated: 1.8 10*3/uL (ref 0.50–4.40)
Lymphocytes Automated: 23.4 %
MCH: 25.6 pg — ABNORMAL LOW (ref 28.0–32.0)
MCHC: 31.5 g/dL — ABNORMAL LOW (ref 32.0–36.0)
MCV: 81.3 fL (ref 80.0–100.0)
MPV: 9.5 fL (ref 9.4–12.3)
Monocytes Absolute Automated: 0.96 10*3/uL (ref 0.00–1.20)
Monocytes: 12.5 %
Neutrophils Absolute: 4.85 10*3/uL (ref 1.80–8.10)
Neutrophils: 63.1 %
Nucleated RBC: 0 /100 WBC (ref 0.0–1.0)
Platelets: 219 10*3/uL (ref 140–400)
RBC: 3.32 10*6/uL — ABNORMAL LOW (ref 4.20–5.40)
RDW: 15 % (ref 12–15)
WBC: 7.69 10*3/uL (ref 3.50–10.80)

## 2016-04-08 LAB — GFR: EGFR: >60

## 2016-04-08 SURGERY — INJECTION, EPIDURAL LUMBAR
Anesthesia: Anesthesia MAC / Sedation | Site: Spine Lumbar | Wound class: Clean

## 2016-04-08 MED ORDER — LACTATED RINGERS IV SOLN
75.0000 mL/h | INTRAVENOUS | Status: DC
Start: 2016-04-08 — End: 2016-04-09

## 2016-04-08 MED ORDER — ONDANSETRON HCL 4 MG/2ML IJ SOLN
4.0000 mg | Freq: Once | INTRAMUSCULAR | Status: DC | PRN
Start: 2016-04-08 — End: 2016-04-08

## 2016-04-08 MED ORDER — PREDNISONE 20 MG PO TABS
20.0000 mg | ORAL_TABLET | Freq: Every morning | ORAL | Status: AC
Start: 2016-04-09 — End: 2016-04-10
  Administered 2016-04-09 – 2016-04-10 (×2): 20 mg via ORAL
  Filled 2016-04-08 (×2): qty 1

## 2016-04-08 MED ORDER — PROPOFOL 10 MG/ML IV EMUL (WRAP)
INTRAVENOUS | Status: AC
Start: 2016-04-08 — End: ?
  Filled 2016-04-08: qty 20

## 2016-04-08 MED ORDER — LABETALOL HCL 5 MG/ML IV SOLN
10.0000 mg | INTRAVENOUS | Status: DC | PRN
Start: 2016-04-08 — End: 2016-04-08

## 2016-04-08 MED ORDER — HYDRALAZINE HCL 20 MG/ML IJ SOLN
10.0000 mg | INTRAMUSCULAR | Status: DC | PRN
Start: 2016-04-08 — End: 2016-04-08

## 2016-04-08 MED ORDER — HYDROMORPHONE HCL 1 MG/ML IJ SOLN
0.5000 mg | INTRAMUSCULAR | Status: DC | PRN
Start: 2016-04-08 — End: 2016-04-08

## 2016-04-08 MED ORDER — FENTANYL CITRATE (PF) 50 MCG/ML IJ SOLN (WRAP)
25.0000 ug | INTRAMUSCULAR | Status: DC | PRN
Start: 2016-04-08 — End: 2016-04-08

## 2016-04-08 MED ORDER — BUPIVACAINE HCL (PF) 0.25 % IJ SOLN
INTRAMUSCULAR | Status: DC | PRN
Start: 2016-04-08 — End: 2016-04-08
  Administered 2016-04-08: 3 mL

## 2016-04-08 MED ORDER — MIDAZOLAM HCL 2 MG/2ML IJ SOLN
INTRAMUSCULAR | Status: AC
Start: 2016-04-08 — End: ?
  Filled 2016-04-08: qty 2

## 2016-04-08 MED ORDER — MIDAZOLAM HCL 5 MG/ML IJ SOLN (WRAP)
INTRAMUSCULAR | Status: DC | PRN
Start: 2016-04-08 — End: 2016-04-08

## 2016-04-08 MED ORDER — DIPHENHYDRAMINE HCL 50 MG/ML IJ SOLN
6.2500 mg | Freq: Once | INTRAMUSCULAR | Status: DC | PRN
Start: 2016-04-08 — End: 2016-04-08

## 2016-04-08 MED ORDER — LACTATED RINGERS IV SOLN
INTRAVENOUS | Status: DC | PRN
Start: 2016-04-08 — End: 2016-04-08

## 2016-04-08 MED ORDER — MEPERIDINE HCL 25 MG/ML IJ SOLN
12.5000 mg | Freq: Once | INTRAMUSCULAR | Status: DC | PRN
Start: 2016-04-08 — End: 2016-04-08

## 2016-04-08 MED ORDER — MIDAZOLAM HCL 2 MG/2ML IJ SOLN
INTRAMUSCULAR | Status: DC | PRN
Start: 2016-04-08 — End: 2016-04-08
  Administered 2016-04-08: 2 mg via INTRAVENOUS

## 2016-04-08 MED ORDER — METHYLPREDNISOLONE ACETATE 40 MG/ML IJ SUSP
INTRAMUSCULAR | Status: DC | PRN
Start: 2016-04-08 — End: 2016-04-08
  Administered 2016-04-08: 40 mg

## 2016-04-08 NOTE — Transfer of Care (Signed)
Anesthesia Transfer of Care Note    Patient: Paula Trujillo    Procedures performed: Procedure(s) with comments:  INJECTION, EPIDURAL LUMBAR - LUMBAR EPIDURAL STEROID INJECTION    Anesthesia type: MAC    Patient location:Phase I PACU    Last vitals:   Filed Vitals:    04/08/16 1540   BP: 121/76   Pulse: 78   Temp:    Resp:    SpO2: 98%       Post pain: Patient not complaining of pain, continue current therapy      Mental Status:awake    Respiratory Function: tolerating room air    Cardiovascular: stable    Nausea/Vomiting: patient not complaining of nausea or vomiting    Hydration Status: adequate    Post assessment: no apparent anesthetic complications    Signed by: Burton Apley Olivya Sobol  04/08/2016 4:54 PM

## 2016-04-08 NOTE — Progress Notes (Signed)
The patient will be npo and would like to have sedation for her block.  She will have esb done in the or later today.

## 2016-04-08 NOTE — Anesthesia Postprocedure Evaluation (Signed)
Anesthesia Post Evaluation    Patient: Paula Trujillo [facility]    Procedure(s) with comments:  INJECTION, EPIDURAL LUMBAR - LUMBAR EPIDURAL STEROID INJECTION    Anesthesia type: MAC    Last Vitals:   Filed Vitals:    04/08/16 1750   BP: 139/82   Pulse: 80   Temp:    Resp: 14   SpO2: 97%       Patient Location: Phase I PACU      Post Pain: Patient not complaining of pain, continue current therapy    Mental Status: awake and alert    Respiratory Function: tolerating room air    Cardiovascular: stable    Nausea/Vomiting: patient not complaining of nausea or vomiting    Hydration Status: adequate    Post Assessment: no apparent anesthetic complications, no reportable events and no evidence of recall               Davina Poke, 04/08/2016 5:58 PM

## 2016-04-08 NOTE — Op Note (Signed)
FULL OPERATIVE NOTE    Date Time: 04/08/2016 4:58 PM  Patient Name: Paula Trujillo  Attending Physician: Verdene Lennert, MD      Date of Operation:   04/08/2016    Providers Performing:   Surgeon(s):  Lolita Cram, MD    Circulator: Jackson Latino, RN  Scrub Person: Romana Juniper, RN    Operative Procedure:   Procedure(s):  INJECTION, EPIDURAL LUMBAR    Preoperative Diagnosis:   Pre-Op Diagnosis Codes:     * Lumbar herniated disc [M51.26]    Postoperative Diagnosis:   Post-Op Diagnosis Codes:     * Lumbar herniated disc [M51.26]    Indications:   Paula Trujillo is a 60 y.o. female who presents to the hospital on 04/04/2016 with worsening, progressive right hip pain.    The pain began suddenly four months ago and has been treated with what sound like three right sided transforaminal epidural  Steroid injections (? L4-5 x 2 and L1-2 x 1). Each resulted in transient numbness and weakness (?local anesthetic) but no improvement in symptoms.  Pain is described as electric shock into lateral right hip - no known triggers and no radiation into leg. 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 She has had oral steroids on three occasions.  She had a good response to the first treatment, minimal response to the second and no response to the third (most recent).    Operative Notes:   Procedure Note: Transforaminal Epidural Steroid Injection, Right L1-2   ?  Risks and benefits were discussed and written consent obtained from the patient. The patient was placed in the prone position. Pulse oximetry, heart rate, and blood pressure were monitored by the anesthesia staff before the procedure, during the procedure, and after the procedure. She received midazolam 2 mg IV. The skin was prepped with Chloraprep and draped in a sterile fashion. With a 25-gauge, 1.5 inch needle, bupivacaine 0.25% was injected  subcutaneously over the entry site. An oblique fluoroscopic view was obtained, with the superior articular process of the lower level aligned with the pedicle of the upper level. The tip of a 22-gauge 3.5-inch Quincke-type spinal needle was advanced toward the 6 o'clock position of the pedicle under intermittent fluoroscopic guidance. Confirmation of proper needle position was made with AP, oblique, and lateral fluoroscopic views. After negative aspiration for blood and cerebrospinal fluid, 1 ml of nonionic contrast agent was slowly injected under live fluoroscopic imaging with digital subtraction which revealed a clear outline of the spinal nerve with proximal spread of agent through the neural foramen into the anterior epidural space. Subsequently, 2 ml of injectate (1 ml Depo-medrol 40 mg/ml and 1.5 ml preservative-free bupivacaine 0.25%) was administered. The needle was withdrawn. The patient tolerated the procedure well and there was no evidence of procedural complications. She was ?brought to the recovery room for observation in stable condition.?  ?  Anesthesia: mild sedation  ?  Complications: none  ?  Plan of care: ?  1. Patient may follow-up with her previous pain physician to complete series of lumbar epidural steroid injection as outpatient.      Estimated Blood Loss:   * No values recorded between 04/08/2016  4:16 PM and 04/08/2016  4:49 PM *    Implants:   * No implants in log *    Drains:   Drains: no  Specimens:   none    Complications:   none    Signed by: Lolita Cram , MD

## 2016-04-08 NOTE — OR Nursing (Signed)
 OF OMNIPAQUE 180 INJECTED BY DR. Alta Corning AT 1640 TO OPERATIVE SITE.

## 2016-04-08 NOTE — Plan of Care (Signed)
Problem: Safety  Goal: Patient will be free from injury during hospitalization  Outcome: Progressing  Pt will remain free from falls. Side rails up x3, bed locked and in lowest position. Fall mats placed on one side- pt refuses for mat to be placed on other side, fall sign posted, personal items and call bell within reach. Pt encouraged to call nurse when assistance required. Will continue to monitor.    Problem: Chronic Pain  Goal: Pain controlled to Patient's (Family's) desired goal  Outcome: Progressing  Pt continues to complain of pain of 9/10 in right hip. Pain medications being given as per orders. Will continue to monitor.

## 2016-04-08 NOTE — Anesthesia Preprocedure Evaluation (Addendum)
Anesthesia Evaluation    AIRWAY    Mallampati: II    TM distance: >3 FB  Neck ROM: full  Mouth Opening:full   CARDIOVASCULAR    cardiovascular exam normal       DENTAL    no notable dental hx     PULMONARY    pulmonary exam normal and clear to auscultation     OTHER FINDINGS                      Anesthesia Plan    ASA 2     MAC                     intravenous induction   Detailed anesthesia plan: MAC        Post op pain management: per surgeon    informed consent obtained    Plan discussed with CRNA and attending.      pertinent labs reviewed

## 2016-04-08 NOTE — Progress Notes (Addendum)
MEDICINE PROGRESS NOTE    Date Time: 04/08/2016 9:47 PM  Patient Name: Paula Trujillo  Attending Physician: Balinda Quails Man, MD    Assessment:     Intractable right hip pain.  L5-S1 disc herniation.  Degenerative disc disease.  History of gastric bypass  Chronic pelvic pain-status post hysterectomy.  Hypertension.    Plan:     Evaluated by neurosurgery and anesthesiology.  Schedule for selective nerve root block later this afternoon in the OR  Continue pain management  Quick taper of prednisone  Cont Lidoderm patch  d/c IVF  Continue deep vein thrombosis prophylaxis.  Discharge planning in am  Case discussed with: pt, family and RN    Safety Checklist:     DVT prophylaxis:  CHEST guideline (See page [phone]) Chemical   Foley:  [place] R[facility] Foley protocol Not present   IVs:  Peripheral IV   PT/OT: Not needed   Daily CBC & or Chem ordered:  SHM/ABIM guidelines (see #5) No   Reference for approximate charges of common labs: CBC auto diff - $76  BMP - $99  Mg - $79    Lines:     Patient Lines/Drains/Airways Status    Active PICC Line / CVC Line / PIV Line / Drain / Airway / Intraosseous Line / Epidural Line / ART Line / Line / Wound / Pressure Ulcer / NG/OG Tube     Name:   Placement date:   Placement time:   Site:   Days:    Peripheral IV 04/08/16 Left Forearm  04/08/16      Forearm   less than 1    Incision Site 04/08/16 Back  04/08/16   1639     less than 1                 Disposition: (Please see PAF column for Expected D/C Date)   Today's date: 04/08/2016  Admit Date: 04/04/2016  3:05 PM  LOS: 1  Clinical Milestones: nerve block  Anticipated discharge needs: none      Subjective     CC: Intractable pain    HPI/Subjective: Cont to have severe pain in the right hip worse with ambulation.     Review of Systems:     Negative except as per hpi    Physical Exam:     VITAL SIGNS PHYSICAL EXAM   Temp:  [96.7 F (35.9 C)-99.2 F (37.3 C)] 96.7 F (35.9 C)  Heart Rate:  [78-96] 95  Resp Rate:  [12-18] 18  BP:  (104-141)/(56-85) 129/75 mmHg  Blood Glucose:          Intake/Output Summary (Last 24 hours) at 04/08/16 2147  Last data filed at 04/08/16 1800   Gross per 24 hour   Intake  387.5 ml   Output      0 ml   Net  387.5 ml    Physical Exam  General: awake, alert X 3 Cardiovascular: regular rate and rhythm, no murmurs, rubs or gallops  Lungs: clear to auscultation bilaterally, without wheezing, rhonchi, or rales  Abdomen: soft, non-tender, non-distended; no palpable masses,  normoactive bowel sounds  Extremities: no edema  Neuro:CN II-XII intact, motor 5/5, sensation intact.       Meds:     Medications were reviewed:    Labs:     Labs (last 72 hours):      Recent Labs  Lab 04/08/16  0550 04/07/16  0420   WBC 7.69 7.17   HGB 8.5* 9.2*  HEMATOCRIT 27.0* 28.8*   PLATELETS 219 235            Recent Labs  Lab 04/08/16  0550 04/07/16  0419  04/05/16  0408   SODIUM 136 138 More results in Results Review 140   POTASSIUM 4.1 4.1 More results in Results Review 3.8   CHLORIDE 107 108 More results in Results Review 109   CO2 21* 24 More results in Results Review 24   BUN 19.0 20.0* More results in Results Review 16.0   CREATININE 0.8 0.7 More results in Results Review 0.8   CALCIUM 8.2* 8.1* More results in Results Review 8.8   ALBUMIN  --   --   --  2.8*   PROTEIN, TOTAL  --   --   --  5.7*   BILIRUBIN, TOTAL  --   --   --  0.3   ALKALINE PHOSPHATASE  --   --   --  35*   ALT  --   --   --  9   AST (SGOT)  --   --   --  12   GLUCOSE 94 107* More results in Results Review 95   More results in Results Review = values in this interval not displayed.                  Signed by: Verdene Lennert, MD

## 2016-04-08 NOTE — Anesthesia Postprocedure Evaluation (Signed)
Anesthesia Post Evaluation    Patient: Paula Trujillo [facility]    Procedure(s) with comments:  INJECTION, EPIDURAL LUMBAR - LUMBAR EPIDURAL STEROID INJECTION    Anesthesia type: MAC    Last Vitals:   Filed Vitals:    04/08/16 1750   BP: 139/82   Pulse: 80   Temp:    Resp: 14   SpO2: 97%            Anesthesia Qualified Clinical Data Registry    Central Line      CVC insertion : NO                                               Perioperative temperature management      General/neuraxial anesthesia > or = 60 minutes (excluding CABG) : NO                                Administration of antibiotic prophylaxis      Age > or = 18, with IV access, with surgical procedure for which antibiotic prophylaxis indicated, and not on chronic antibiotics : NO                  Medication Administration      Ordering or administration of drug inconsistent with intended drug, dose, delivery or timing : NO      Dental loss/damage      Dental injury with administration of anesthesia : NO      Difficult intubation due to unrecognized difficult airway        Elective airway procedure including but not limited to: tracheostomy, fiberoptic bronchoscopy, rigid bronchoscopy; jet ventilation; or elective use of a device to facilitate airway management such as a Glidescope : NO                > Unanticipated difficult intubation post pre-evaluation : NO      Aspiration of gastric contents        Aspiration of gastric contents : NO                    Surgical fire        Procedure requiring electrocautery/laser : NO                    Immediate perioperative cardiac arrest        Cardiac arrest in OR or PACU : NO                    Unplanned hospital admission        Unplanned hospital admission for initially intended outpatient anesthesia service : NO      Unplanned ICU admission        Unplanned ICU admission related to anesthesia occurring within 24 hours of induction or start of MAC : NO      Surgical case cancellation        Cancellation of procedure  after care already started by anesthesia care team : NO      Post-anesthesia transfer of care checklist/protocol to PACU        Transfer from OR to PACU upon case conclusion : YES              > Use of PACU transfer checklist/protocol : YES     (  Includes the key elements of: patient identification, responsible practitioner identification (PACU nurse or advanced practitioner), discussion of pertinent history and procedure course, intraoperative anesthetic management, post-procedure plans, acknowledgement/questions)    Post-anesthesia transfer of care checklist/protocol to ICU        Transfer from OR to ICU upon case conclusion : NO                    Post-operative nausea/vomiting risk protocol        Post-operative nausea/vomiting risk protocol : YES  Patient > or = 18 with care initiated by anesthesia team that has a risk factor screen for post-op nausea/vomiting (Includes female, hx PONV, or motion sickness, non-smoker, intended opioid administration for post-op analgesia.)    Anaphylaxis        Anaphylaxis during anesthesia services : NO    (Inclusive of any suspected transfusion reaction in association with blood-bank confirmed blood product incompatibility)              Autry Prust, 04/08/2016 5:59 PM

## 2016-04-08 NOTE — Consults (Signed)
H&P Update      Date Time: 04/08/2016 3:52 PM  Patient Name: Paula Trujillo, Paula Trujillo  Requesting Physician: Verdene Lennert, MD    H&P by Dr. Fredricka Bonine reviewed. Patient examined. Proceed with lumbar epidural steroid injection.    Reason for Consultation:   Right hip pain    Assessment:   Spasms of right, lateral hip pain, poorly controlled with oral analgesics.    Plan:   Dr Deloria Lair from neurosurgery to see patient today and make recommendations.   Could repeat transforaminal epidural steroid injections although they have not been helpful in the past.  Also, unsure if patient could tolerate without sedation (and she has just eaten lunch).    History:   Paula Trujillo is a 60 y.o. female who presents to the hospital on 04/04/2016 with worsening, progressive right hip pain.  The pain began suddenly four months ago and has been treated with what sound like two right sided transforaminal epidural  Steroid injections (? L5S1 and L1,2).  Each resulted in transient numbness and weakness (?local anesthetic) but no improvement in symptoms.  Pain is described as electric shock into lateral right hip - no known triggers and no radiation into leg.  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  She has had oral steroids on three occasions.  She had a good response to the first treatment, minimal response to the second and no response to the third (most recent).      Past Medical History:     Past Medical History   Diagnosis Date   . Lower back pain    . Neck pain        Past Surgical History:   History reviewed. No pertinent past surgical history.    Family History:     Family History   Problem Relation Age of Onset   . Cancer Sister        Social History:     Social History     Social History   . Marital Status: Married     Spouse Name: N/A   . Number of Children: N/A   . Years of Education: N/A     Social History Main  Topics   . Smoking status: Never Smoker    . Smokeless tobacco: Not on file   . Alcohol Use: No   . Drug Use: No   . Sexual Activity: Not on file     Other Topics Concern   . Not on file     Social History Narrative       Allergies:   No Known Allergies    Medications:     Current Facility-Administered Medications   Medication Dose Route Frequency   . cyclobenzaprine  10 mg Oral Q8H   . enoxaparin  40 mg Subcutaneous QHS   . gabapentin  300 mg Oral Q8H SCH   . HYDROmorphone  1 mg Intravenous Once   . lidocaine  1 patch Transdermal Q24H   . predniSONE  40 mg Oral Daily   . valsartan-hydroCHLOROthiazide  160-12.5 (DIOVAN HCT) combo dose   Oral Daily       Review of Systems:       Physical Exam:   Awake, sedated but easily arousable and appropriate  Filed Vitals:    04/08/16 1540   BP: 121/76   Pulse: 78   Temp:  Resp:    SpO2: 98%           Labs Reviewed:     Results     Procedure Component Value Units Date/Time    Basic Metabolic Panel [[id number]]  (Abnormal) Collected:  04/08/16 0550    Specimen Information:  Blood Updated:  04/08/16 0658     Glucose 94 mg/dL      BUN 40.9 mg/dL      Creatinine 0.8 mg/dL      Calcium 8.2 (L) mg/dL      Sodium 811 mEq/L      Potassium 4.1 mEq/L      Chloride 107 mEq/L      CO2 21 (L) mEq/L     GFR [[id number]] Collected:  04/08/16 0550     EGFR >60.0 Updated:  04/08/16 0658    CBC and differential [[id number]]  (Abnormal) Collected:  04/08/16 0550    Specimen Information:  Blood from Blood Updated:  04/08/16 0620     WBC 7.69 x10 3/uL      Hgb 8.5 (L) g/dL      Hematocrit 46.9 (L) %      Platelets 219 x10 3/uL      RBC 3.32 (L) x10 6/uL      MCV 81.3 fL      MCH 25.6 (L) pg      MCHC 31.5 (L) g/dL      RDW 15 %      MPV 9.5 fL      Neutrophils 63.1 %      Lymphocytes Automated 23.4 %      Monocytes 12.5 %      Eosinophils Automated 0.5 %      Basophils Automated 0.1 %      Immature Granulocyte 0.4 %      Nucleated RBC 0.0 /100 WBC      Neutrophils Absolute 4.85 x10 3/uL      Abs  Lymph Automated 1.80 x10 3/uL      Abs Mono Automated 0.96 x10 3/uL      Abs Eos Automated 0.04 x10 3/uL      Absolute Baso Automated 0.01 x10 3/uL      Absolute Immature Granulocyte 0.03 x10 3/uL      Absolute NRBC 0.00 x10 3/uL               Rads:     Lumbar MRI this admission:    L1-L2: There is mild disc space narrowing and a small disc bulge which  is slightly asymmetric towards the right. There is no central canal  stenosis. There is no foraminal narrowing. There is mild facet  arthropathy.     L2-L3: Normal disc signal and morphology. No central canal or foraminal  narrowing. There is mild facet arthropathy.     L3-L4: Normal disc signal and morphology. No central canal or foraminal  narrowing. There is mild facet arthropathy.         L4-L5: There is disc space narrowing and disc desiccation. There is a  diffuse disc bulge with bilateral foraminal extension as well as  extension into the lateral recess bilaterally.  There is contact of the  traversing L5 nerve roots bilaterally. There is facet arthropathy and  ligamentum flavum redundancy. There is no central canal stenosis. There  is mild right foraminal narrowing. The left foramen is patent.       L5-S1: There is a mild disc bulge with a superimposed right paracentral  disc protrusion best demonstrated  on image 4, series 7 there is contact  of the traversing right S1 nerve root as well as posterior and leftward  displacement of the thecal sac. There is mild central canal stenosis.  There is mild bilateral foraminal narrowing. There is facet arthropathy.     Post contrast imaging demonstrates no abnormal enhancement.     IMPRESSION:        1.  Multilevel lumbar spondylosis most prominent at L4-L5 and L5-S1.     2.  At L4-L5 there is effacement of the lateral recess bilaterally and  contact of the traversing L5 nerve roots. There is mild right-sided  foraminal narrowing.     3.  At L5-S1 there is a small right paracentral disc protrusion  contacting the  traversing right S1 nerve root as well as resultant mass  effect upon the thecal sac.  There is mild central canal stenosis and  mild bilateral foraminal narrowing.     4.  Multilevel facet arthropathy.      Signed by: Lolita Cram

## 2016-04-08 NOTE — Progress Notes (Signed)
Pt returned from PACU. AO x4. Pt able to walk to bed. Pt complains of pain 8/10 reporting it is "about the same" in the right hip. Injection site is clean, dry, intact. Bainaid over injection site clean, dry. Will continue to monitor.

## 2016-04-09 DIAGNOSIS — I10 Essential (primary) hypertension: Secondary | ICD-10-CM

## 2016-04-09 MED ORDER — GABAPENTIN 300 MG PO CAPS
400.0000 mg | ORAL_CAPSULE | Freq: Three times a day (TID) | ORAL | Status: DC
Start: 2016-04-09 — End: 2016-04-10
  Administered 2016-04-09 – 2016-04-10 (×3): 400 mg via ORAL
  Filled 2016-04-09 (×6): qty 1

## 2016-04-09 MED ORDER — ACETAMINOPHEN 325 MG PO TABS
650.0000 mg | ORAL_TABLET | ORAL | Status: DC | PRN
Start: 2016-04-09 — End: 2016-04-10

## 2016-04-09 MED ORDER — OXYCODONE-ACETAMINOPHEN 5-325 MG PO TABS
2.0000 | ORAL_TABLET | ORAL | Status: DC | PRN
Start: 2016-04-09 — End: 2016-04-10

## 2016-04-09 NOTE — Progress Notes (Signed)
[facility] Hospitalist Service Melton Krebs, MD paged related to reason why patient not on Estrogens Conjugated (Premarin PO). Return call received from Dr. Cyndie Chime with instruction to let patient know that the medication will be added to her current list. Patient and family made aware.

## 2016-04-09 NOTE — Progress Notes (Signed)
ADULT OBSERVATION UNIT       PROGRESS NOTE    Date Time: 04/09/2016 6:29 PM  Patient Name: Paula Trujillo, Paula Trujillo      ASSESSMENT:  Patient received alert, oriented x4, VSS, and awake at this time. IV saline locked. Plan of care discussed with patient and family.Will continue to monitor at this time.    Subjective:  Patient report right hip pain, 9/10 relieved by prn dilaudid.    Physical Exam:  Filed Vitals:    04/09/16 0753   BP: 128/83   Pulse: 75   Temp: 96.8 F (36 C)   Resp: 16   SpO2: 96%             Signed by: Cristopher Estimable

## 2016-04-09 NOTE — Plan of Care (Signed)
Problem: Health Promotion  Goal: Knowledge - disease process  Extent of understanding conveyed about a specific disease process.   Outcome: Progressing  Patient and family will be included in discussions and taught the right way for providing support and care.         Problem: Chronic Pain  Goal: Pain controlled to Patient's (Family's) desired goal  Outcome: Progressing  Patient will use a pain rating scale to identify current level of pain intensity and determines a comfort or function goal.

## 2016-04-09 NOTE — Progress Notes (Signed)
MEDICINE PROGRESS NOTE    Date Time: 04/09/2016 12:03 PM  Patient Name: Paula Trujillo  Attending Physician: Balinda Quails Man, MD    Assessment/Plan:     Intractable right hip pain with L5-S1 disc herniation.  - on pain management  - MRI of L spine--> Multilevel lumbar spondylosis most prominent at L4-L5 and L5-S1. L5-S1 there is a small right paracentral disc protrusion  - Right hip x ray--> No evidence of acute bony process   - S/p lumbar ESI by pain management team  - Needs to F/u with primary pain management team for further ESI as out patient  - increased Neurontin and Percocet. Also on Lidocaine and oral Dilaudid.  - Avoid IV Narcotics  - will taper off Prednisone sooner  - possible [place] in AM    Hypertension  - better controlled with Diovan+ HCTZ     History of gastric bypass, Chronic pelvic pain-status post hysterectomy.  .    Safety Checklist:     DVT prophylaxis:  CHEST guideline (See page [id number]) Chemical   Foley:  [place] Rn Foley protocol Not present   IVs:  Peripheral IV   PT/OT: Not needed   Daily CBC & or Chem ordered:  SHM/ABIM guidelines (see #5) No   Reference for approximate charges of common labs: CBC auto diff - $76  BMP - $99  Mg - $79    Lines:     Patient Lines/Drains/Airways Status    Active PICC Line / CVC Line / PIV Line / Drain / Airway / Intraosseous Line / Epidural Line / ART Line / Line / Wound / Pressure Ulcer / NG/OG Tube     Name:   Placement date:   Placement time:   Site:   Days:    Peripheral IV 04/08/16 Left Forearm  04/08/16      Forearm   less than 1    Incision Site 04/08/16 Back  04/08/16   1639     less than 1                 Disposition: (Please see PAF column for Expected D/C Date)   Today's date: 04/09/2016  Admit Date: 04/04/2016  3:05 PM  LOS: 2  Clinical Milestones: nerve block  Anticipated discharge needs: none      Subjective     CC: Intractable pain    HPI/Subjective:   6/19. Cont to have severe pain in the right hip worse with ambulation.  6/20. Continuous pain  in right hip, 10/10, worse with movement, better with pain medication, shooting type of sharp pain with radiation to lower thigh. No fever, chills, no focal weakness, numbness, and bladder and bowel incontinence     Review of Systems:     Negative except as per hpi    Physical Exam:     VITAL SIGNS PHYSICAL EXAM   Temp:  [96.7 F (35.9 C)-99.2 F (37.3 C)] 96.8 F (36 C)  Heart Rate:  [75-95] 75  Resp Rate:  [12-18] 16  BP: (121-175)/(75-98) 128/83 mmHg  Blood Glucose:          Intake/Output Summary (Last 24 hours) at 04/09/16 1203  Last data filed at 04/08/16 1800   Gross per 24 hour   Intake  387.5 ml   Output      0 ml   Net  387.5 ml    Physical Exam  General: awake, alert X 3, moderately distress due to pain  HEENT- NC, AT, PERLA+, no  pallor/icterus  Neck- supple, no mass/JVD  Cardiovascular: regular rate and rhythm, no murmurs, rubs or gallops  Lungs: clear to auscultation bilaterally, without wheezing, rhonchi, or rales  Abdomen: soft, non-tender, non-distended; no palpable masses,  normoactive bowel sounds  Extremities: no edema, cyanosis, clubbing. SLR- negative  Neuro:CN II-XII intact, motor 5/5, sensation intact.  Skin- warm, no rashes       Meds:     Medications were reviewed:    Labs:     Labs (last 72 hours):      Recent Labs  Lab 04/08/16  0550 04/07/16  0420   WBC 7.69 7.17   HGB 8.5* 9.2*   HEMATOCRIT 27.0* 28.8*   PLATELETS 219 235            Recent Labs  Lab 04/08/16  0550 04/07/16  0419  04/05/16  0408   SODIUM 136 138 More results in Results Review 140   POTASSIUM 4.1 4.1 More results in Results Review 3.8   CHLORIDE 107 108 More results in Results Review 109   CO2 21* 24 More results in Results Review 24   BUN 19.0 20.0* More results in Results Review 16.0   CREATININE 0.8 0.7 More results in Results Review 0.8   CALCIUM 8.2* 8.1* More results in Results Review 8.8   ALBUMIN  --   --   --  2.8*   PROTEIN, TOTAL  --   --   --  5.7*   BILIRUBIN, TOTAL  --   --   --  0.3   ALKALINE PHOSPHATASE   --   --   --  35*   ALT  --   --   --  9   AST (SGOT)  --   --   --  12   GLUCOSE 94 107* More results in Results Review 95   More results in Results Review = values in this interval not displayed.                  Signed by: Lucillie Garfinkel, MD

## 2016-04-09 NOTE — Plan of Care (Signed)
Problem: Chronic Pain  Goal: Pain controlled to Patient's (Family's) desired goal  Outcome: Progressing  Patient will be free of pain. Patient pain will be under control. Continue current dose/order of PRN pain medication.     Comments:   Pt resting bed with family at bedside VSS, aox2 to 3 with confusion at times, pt rates pain 0/10 at this time.  IV in place.  No signs of distress at this time.  Ambulates to restroom with slow steady gait, supervised by family and staff. Pt is a moderate fall risk.  Siderails up x2, bed locked and in low position, floor mat in place, and callbell within reach.  Pt verbalized understanding of how to use callbell. Pt informed of importance of ambulating with a nurse. Will continue to monitor. May discharge in the morning.

## 2016-04-09 NOTE — Plan of Care (Signed)
Problem: Safety  Goal: Patient will be free from injury during hospitalization  Outcome: Progressing  Pt is moderate falls risk.  Ambulates with standby assist.  Bed in lowest position, floor mat down, bed alarm activated, arm band is on, sign posted. Bedside table and call bell is within reach.  Pt verbalizes understanding of need to call for assistance/ambulation.  Communication board updated.    Problem: Chronic Pain  Goal: Pain controlled to Patient's (Family's) desired goal  Outcome: Progressing  Patient reports right hip pain 9/10, relieved by dilaudid PO. Will continue to monitor.

## 2016-04-09 NOTE — Progress Notes (Signed)
04/09/16 0819   Patient Type   Within 30 Days of Previous Admission? No   Medicare focused diagnosis patient? Not a Medicare focused diagnosis patient   Bundle patient? Not a bundle patient   Healthcare Decisions   Interviewed: Patient   Orientation/Decision Making Abilities of Patient Alert and Oriented x3, able to make decisions   Advance Directive Patient does not have advance directive   Healthcare Agent Appointed No   Prior to admission   Prior level of function Independent with ADLs;Ambulates independently   Type of Residence Private residence   Home Layout Multi-level;Stairs to enter with rails (add number in comment)   Have running water, electricity, heat, etc? Yes   Living Arrangements Spouse/significant other   How do you get to your MD appointments? Self   How do you get your groceries? Self   Who fixes your meals? Self   Who does your laundry? Self   Who picks up your prescriptions? Self   Dressing Independent   Grooming Independent   Feeding Independent   Bathing Independent   Toileting Independent   DME Currently at Home (None)   Name of Prior Assisted Living Facility (None)   Home Care/Community Services None   Adult Protective Services (APS) involved? No   Discharge Planning   Support Systems Spouse/significant other   Patient expects to be discharged to: Home   Anticipated [place] plan discussed with: Same as interviewed   Follow up appointment scheduled? No   Reason no follow up scheduled? Patient declined   Mode of transportation: Private car (family member)   Consults/Providers   PT Evaluation Needed 1   OT Evalulation Needed 2   SLP Evaluation Needed 2   Outcome Palliative Care Screen Screened but did not meet criteria for intervention   Correct PCP listed in Epic? Yes   Important Message from Medicare Notice   Patient received 1st IMM Letter? n/a     Geryl Rankins, MSW  [facility] Discharge Planner  NPT 6, Amb Obs   [phone]

## 2016-04-09 NOTE — Progress Notes (Signed)
INPATIENT PAIN SERVICE PROGRESS NOTE    Assessment:   60F admitted on 04/04/2016 with worsening, progressive right hip pain of unknown etiology   Pain began suddenly four months ago   Patient underwent transforaminal epidural steroid injections at ?L4-5 x 2 and L1-2 x 1 at [facility] in [place]. Prior injections resulted in transient numbness but no durable improvement in symptoms   Patient also previously underwent right greater trochanteric bursa injection by Dr. Lewie Chamber   She has had oral steroids on three occasions.  She had a good response to the first treatment, minimal response to the second and no response to the third (most recent).   Pain service was requested to perform repeat transforaminal epidural steroid injection, which was performed on 04/08/2016   Patient reports no significant change in her symptoms since the injection    Plan:   S/p Right L1-2 TFESI on 04/08/2016. Patient was counseled that the effect of steroid injection may take 3-7 days before it becomes noticeable.  Would defer repeat injection for at least 1-2 weeks to allow most recent injection adequate time to achieve effect. Patient may follow-up with her previous pain physician to complete series of lumbar epidural steroid injection as outpatient.   Agree with EMG/NCS, per neurosurgery recs   Continue PT/OT as tolerated   Continue PO analgesic regimen, per primary team    Thank you for involving [place] in the care of this patient. We will sign-off for now. Please call if we can be of assistance in the future.    Pain Rx:  As above and below:    All Rx:  Scheduled Meds:  Current Facility-Administered Medications   Medication Dose Route Frequency   . cyclobenzaprine  10 mg Oral Q8H   . enoxaparin  40 mg Subcutaneous QHS   . gabapentin  400 mg Oral Q8H SCH   . HYDROmorphone  1 mg Intravenous Once   . lidocaine  1 patch Transdermal Q24H   . predniSONE  20 mg Oral QAM   . valsartan-hydroCHLOROthiazide  160-12.5 (DIOVAN HCT) combo  dose   Oral Daily     Continuous Infusions:   PRN Meds:.acetaminophen, calcium carbonate, diphenhydrAMINE, HYDROmorphone, ondansetron **OR** ondansetron, oxyCODONE-acetaminophen, senna-docusate    Diet:  Diet regular    Interval History:  Patient reports severe pain.  Pain Location: right lateral hip  Comments: no significant change after TFESI    History:  Paula Trujillo is a 60 y.o. female admitted on 04/04/2016 with Lumbar radiculopathy [M54.16]  Intractable pain [R52]  Pain of right hip joint [M25.551].  LOS:  LOS: 2 days     Surgery: s/p Procedure(s):  INJECTION, EPIDURAL LUMBAR for Lumbar herniated disc [M51.26].    POD: 1 Day Post-Op    Additional History:  Past Medical History   Diagnosis Date   . Lower back pain    . Neck pain      Past Surgical History   Procedure Laterality Date   . Injection, epidural lumbar N/A 04/08/2016     Procedure: INJECTION, EPIDURAL LUMBAR;  Surgeon: Lolita Cram, MD;  Location: [facility];  Service: Anesthesiology;  Laterality: N/A;  LUMBAR EPIDURAL STEROID INJECTION       Allergies:  No Known Allergies    Review of Systems:  NG tube:no    Foley: no  Voiding: yes  Chest tube: no  Side effects: no:    Exam:  Filed Vitals:    04/09/16 0753   BP: 128/83  Pulse: 75   Temp: 36 C (96.8 F)   Resp: 16   SpO2: 96%       Level of Consciousness: awake and alert   Additional Exam:    Labs:  No results for input(s): WBC, HGB, HCT in the last 24 hours.    Invalid input(s):  PLT  No results for input(s): PT, INR, PTT in the last 24 hours.  No results for input(s): BUN, CREAT, K, AST, ALT in the last 24 hours.    Invalid input(s): ALP, BILIT    Level of Service:  2    Signature:  Lolita Cram  04/09/2016 3:12 PM

## 2016-04-10 MED ORDER — TAPENTADOL HCL 50 MG PO TABS
100.0000 mg | ORAL_TABLET | Freq: Three times a day (TID) | ORAL | Status: DC | PRN
Start: 2016-04-10 — End: 2018-02-05

## 2016-04-10 MED ORDER — SENNOSIDES-DOCUSATE SODIUM 8.6-50 MG PO TABS
2.0000 | ORAL_TABLET | Freq: Two times a day (BID) | ORAL | Status: AC | PRN
Start: 2016-04-10 — End: ?

## 2016-04-10 MED ORDER — ESTROGENS CONJUGATED 0.45 MG PO TABS
0.9000 mg | ORAL_TABLET | Freq: Every day | ORAL | Status: DC
Start: 2016-04-10 — End: 2016-04-10
  Administered 2016-04-10: 0.9 mg via ORAL
  Filled 2016-04-10: qty 2

## 2016-04-10 MED ORDER — HYDROMORPHONE HCL 2 MG PO TABS
2.0000 mg | ORAL_TABLET | ORAL | Status: DC | PRN
Start: 2016-04-10 — End: 2018-02-05

## 2016-04-10 MED ORDER — ACETAMINOPHEN 325 MG PO TABS
650.0000 mg | ORAL_TABLET | Freq: Four times a day (QID) | ORAL | Status: AC | PRN
Start: 2016-04-10 — End: ?

## 2016-04-10 MED ORDER — LIDOCAINE 5 % EX PTCH
1.0000 | MEDICATED_PATCH | Freq: Every day | CUTANEOUS | Status: AC
Start: 2016-04-10 — End: ?

## 2016-04-10 MED ORDER — GABAPENTIN 400 MG PO CAPS
400.0000 mg | ORAL_CAPSULE | Freq: Three times a day (TID) | ORAL | Status: AC
Start: 2016-04-10 — End: ?

## 2016-04-10 MED ORDER — CYCLOBENZAPRINE HCL 10 MG PO TABS
10.0000 mg | ORAL_TABLET | Freq: Three times a day (TID) | ORAL | Status: AC | PRN
Start: 2016-04-10 — End: ?

## 2016-04-10 NOTE — Progress Notes (Signed)
This note also relates to the following rows which could not be included:  LACE Score for RWB Report - Cannot attach notes to read only rows         04/10/16 1314   Discharge Disposition   Patient preference/choice provided? N/A   Physical Discharge Disposition Home with Needs   Mode of Transportation Car   Pick up time 1330   Patient/Family/POA notified of transfer plan Yes   Patient agreeable to discharge plan/expected d/c date? Yes   Family/POA agreeable to discharge plan/expected d/c date? Yes   Bedside nurse notified of transport plan? Yes   Special requirements for patient during transport: (None)   Outpatient Services   Home Health (None)   Services (None)   CM Interventions   Follow up appointment scheduled? No   Reason no follow up scheduled? Patient declined   Referral made for home health RN visit? Patient declined   Multidisciplinary rounds/family meeting before d/c? Yes   Medicare Checklist   Is this a Medicare patient? No   Patient received 1st IMM Letter? n/a     Geryl Rankins, MSW  [facility] Discharge Planner  NPT 6, Amb Obs   [phone]

## 2016-04-10 NOTE — Progress Notes (Signed)
Met with patient in hospital room. Discussed home health services recommended. Patient declined services. Home health liaison offered to leave contact information with patient which she declined. Patient has 48hours after hospital discharge to contact home health services, incase she changes her mind. [employer]ol[employer] case Production designer, theatre/television/film, notified.

## 2016-04-10 NOTE — Discharge Summary (Signed)
MEDICINE DISCHARGE SUMMARY    Date Time: 04/10/2016 10:23 AM  Patient Name: Paula Trujillo  Attending Physician: Balinda Quails Man, MD  Primary Care Physician: Patsy Lager, MD    Date of Admission: 04/04/2016  Date of Discharge: 04/10/2016    Discharge Diagnoses:     Principal Problem:    Intractable pain  Active Problems:    Intractable right hip pain    Lumbar nerve root impingement    Lumbar radiculopathy  Resolved Problems:    * No resolved hospital problems. *      Disposition:     Home with family    Pending Results, Recommendations & Instructions to providers after discharge:     1. Micro / Labs / Path pending: none    Recent Labs - Last 2:         Recent Labs  Lab 04/08/16  0550 04/07/16  0420   WBC 7.69 7.17   HGB 8.5* 9.2*   HEMATOCRIT 27.0* 28.8*   PLATELETS 219 235             Recent Labs  Lab 04/05/16  0408   ALKALINE PHOSPHATASE 35*   BILIRUBIN, TOTAL 0.3   PROTEIN, TOTAL 5.7*   ALBUMIN 2.8*   ALT 9   AST (SGOT) 12        Recent Labs  Lab 04/08/16  0550 04/07/16  0419   SODIUM 136 138   POTASSIUM 4.1 4.1   CHLORIDE 107 108   CO2 21* 24   BUN 19.0 20.0*   GLUCOSE 94 107*   CALCIUM 8.2* 8.1*                 Invalid input(s): FREET4         Procedures/Radiology performed:     XR HIP RIGHT 1 VW WITH PELVIS  MRI LUMBAR SPINE W WO CONTRAST  CT LUMBAR SPINE WO CONTRAST  FL FLUORO < 1 HOUR       Hospital Course:     Reason for admission/ HPI/Hospital Course:     6/19. Cont to have severe pain in the right hip worse with ambulation.  6/20. Continuous pain in right hip, 10/10, worse with movement, better with pain medication, shooting type of sharp pain with radiation to lower thigh. No fever, chills, no focal weakness, numbness, and bladder and bowel incontinence       Summary/plan    Intractable right hip pain with L5-S1 disc herniation.  - on pain management, no surgical intervention required as per NS team  - MRI of L spine--> Multilevel lumbar spondylosis most prominent at L4-L5 and L5-S1. L5-S1 there  is a small right paracentral disc protrusion  - Right hip x ray--> No evidence of acute bony process   - S/p lumbar ESI by pain management team  - Needs to F/u with primary pain management team for further ESI as out patient  - increased Neurontin and Percocet--> better pain controlled today. Also on Lidocaine patch and oral Dilaudid.  - Avoided IV Narcotics  - stopped Prednisone  - PT/OT--> home without any needs  - has been advised to have EMG study with Dr. Hanley Ben, [employer] in two weeks as out patient.  - possible [place] in AM, stable to be discharged home with family    Hypertension  - better controlled with Diovan+ HCTZ     History of gastric bypass, Chronic pelvic pain-status post hysterectomy.      Discharge Day Exam:  Temp:  [97.6 F (  36.4 C)-97.8 F (36.6 C)] 97.7 F (36.5 C)  Heart Rate:  [89-101] 101  Resp Rate:  [17-18] 17  BP: (113-123)/(59-75) 123/75 mmHg    General: awake, alert X 3, moderately distress due to pain  HEENT- NC, AT, PERLA+, no pallor/icterus  Neck- supple, no mass/JVD  Cardiovascular: regular rate and rhythm, no murmurs, rubs or gallops  Lungs: clear to auscultation bilaterally, without wheezing, rhonchi, or rales  Abdomen: soft, non-tender, non-distended; no palpable masses, normoactive bowel sounds  Extremities: no edema, cyanosis, clubbing. SLR- negative  Neuro:CN II-XII intact, motor 5/5, sensation intact.  Skin- warm, no rashes    Consultations:     Treatment Team: Attending Provider: Balinda Quails Man, MD; Consulting Physician: Marlaine Hind, MD; Case Manager: Geryl Rankins; Case Manager: Jodi Marble; Surgeon: Lolita Cram, MD; Registered Nurse: Evelina Bucy, RN    Discharge Condition:     stable    Discharge Instructions & Follow Up Plan for Patient:     Diet: cardiac diet    Activity/Weight Bearing Status: as tolerated    Patient was instructed to follow up with:   Primary Care Doctor Patsy Lager, MD in two weeks & the following  Consultants:  1. Primary pain management team in a week  2. Dr. Luan Pulling, [facility] Bee- in two weeks for NC/EMG study    Discharge Code Status: full code    Minutes spent coordinating discharge and reviewing discharge plan: 30 minutes    Discharge Medications:        Medication List      START taking these medications          acetaminophen 325 MG tablet   Commonly known as:  TYLENOL   Take 2 tablets (650 mg total) by mouth every 6 (six) hours as needed for Pain.       cyclobenzaprine 10 MG tablet   Commonly known as:  FLEXERIL   Take 1 tablet (10 mg total) by mouth 3 (three) times daily as needed for Muscle spasms.       HYDROmorphone 2 MG tablet   Commonly known as:  DILAUDID   Take 1 tablet (2 mg total) by mouth every 4 (four) hours as needed.       lidocaine 5 %   Commonly known as:  LIDODERM   Place 1 patch onto the skin daily. Remove & Discard patch within 12 hours or as directed by MD       senna-docusate 8.6-50 MG per tablet   Commonly known as:  PERICOLACE   Take 2 tablets by mouth 2 (two) times daily as needed for Constipation.         CHANGE how you take these medications          gabapentin 400 MG capsule   Commonly known as:  NEURONTIN   Take 1 capsule (400 mg total) by mouth 3 (three) times daily.   What changed:    - medication strength  - how much to take       tapentadol HCl 50 MG Tabs tablet   Commonly known as:  NUCYNTA   Take 2 tablets (100 mg total) by mouth 3 (three) times daily as needed.   What changed:    - how much to take  - when to take this  - reasons to take this         CONTINUE taking these medications          albuterol 108 (90 Base) MCG/ACT inhaler  Commonly known as:  PROVENTIL HFA;VENTOLIN HFA       BENICAR HCT 20-12.5 MG per tablet   Generic drug:  olmesartan-hydrochlorothiazide       oxyCODONE 10 MG Tabs       PREMARIN PO         STOP taking these medications          albuterol-ipratropium 20-100 MCG/ACT Aers   Commonly known as:  COMBIVENT RESPIMAT       celecoxib 200 MG capsule    Commonly known as:  CeleBREX       methylPREDNISolone 4 MG tablet   Commonly known as:  MEDROL DOSPACK            Where to Get Your Medications      You can get these medications from any pharmacy     Bring a paper prescription for each of these medications    - acetaminophen 325 MG tablet  - cyclobenzaprine 10 MG tablet  - gabapentin 400 MG capsule  - HYDROmorphone 2 MG tablet  - lidocaine 5 %  - senna-docusate 8.6-50 MG per tablet  - tapentadol HCl 50 MG Tabs tablet            [facility]  Department of Medicine  P: [phone]  F: [phone]    Signed by: Yisroel Ramming Man Verlin Grills, MD    CC: Patsy Lager, MD

## 2016-04-10 NOTE — Progress Notes (Signed)
ADULT OBSERVATION UNIT  DISCHARGE NOTE      Date Time: 04/10/2016 12:53 PM  Patient Name: Paula Trujillo  Attending Physician: Balinda Quails Man, MD      Date of Admission:   04/04/2016        Discharge Instructions:        Patient stable for discharge. Discharge instructions given, patient teachings done and verbalized understanding. All questions answered at this time. Aware to follow-up and schedule appointments as necessary. IV access and cardiac monitor discontinued. Discharge to home with family.  Wheelchair transport requested.    Patient aware to follow-up with MD and PMD as needed.    Signed by: Cristopher Estimable

## 2016-04-10 NOTE — Progress Notes (Signed)
ADULT OBSERVATION UNIT       PROGRESS NOTE    Date Time: 04/10/2016 9:55 AM  Patient Name: Paula Trujillo, Paula Trujillo      ASSESSMENT:  Patient received alert, oriented, and awake at this time. IV in left forearm saline locked. Plan of care discussed with patient. Needs anticipated. Will continue to monitor at this time.    Subjective:  Patient states having 7/10 pain at this time.    Physical Exam:  Filed Vitals:    04/10/16 0908   BP: 123/75   Pulse: 101   Temp: 97.7 F (36.5 C)   Resp: 17   SpO2: 100%             Signed by: Evelina Bucy

## 2016-04-10 NOTE — Plan of Care (Signed)
Problem: Moderate/High Fall Risk Score >5  Goal: Patient will remain free of falls  Outcome: Progressing  Pt is alert and oriented. Pt is a moderate falls risk. Call bell and bedside table are within reach. Fall mat in place at bedside. Bed is in lowest position. Pt verbalizes understanding of safety precautions and will call nurse for assistance. Will continue to monitor.     Problem: Chronic Pain  Goal: Pain controlled to Patient's (Family's) desired goal  Outcome: Progressing  Pt is alert and oriented. Pt rates 7/10 pain. Pt is declining pain meds at this time. Will continue to monitor and reassess.

## 2016-04-11 ENCOUNTER — Other Ambulatory Visit (INDEPENDENT_AMBULATORY_CARE_PROVIDER_SITE_OTHER): Payer: Self-pay

## 2016-04-11 DIAGNOSIS — M5416 Radiculopathy, lumbar region: Secondary | ICD-10-CM

## 2016-04-16 ENCOUNTER — Other Ambulatory Visit: Payer: Self-pay

## 2016-04-24 ENCOUNTER — Other Ambulatory Visit: Payer: Self-pay

## 2016-08-01 ENCOUNTER — Ambulatory Visit: Payer: Self-pay

## 2018-07-16 ENCOUNTER — Other Ambulatory Visit: Payer: Self-pay

## 2019-12-22 ENCOUNTER — Other Ambulatory Visit: Payer: Self-pay | Admitting: Internal Medicine

## 2021-05-11 IMAGING — CR FOOT RT 3 VWS MIN
1 series · 3 of 3 positions shown · non-contrast
Comparison: None

Right foot 3 views weightbearing

INDICATIONS:  Hypertrophy of bone, ankle and foot

[Series 1: x foot ap right · 0.15mm/px · 3 of 3 slices shown]
[im 1/3]
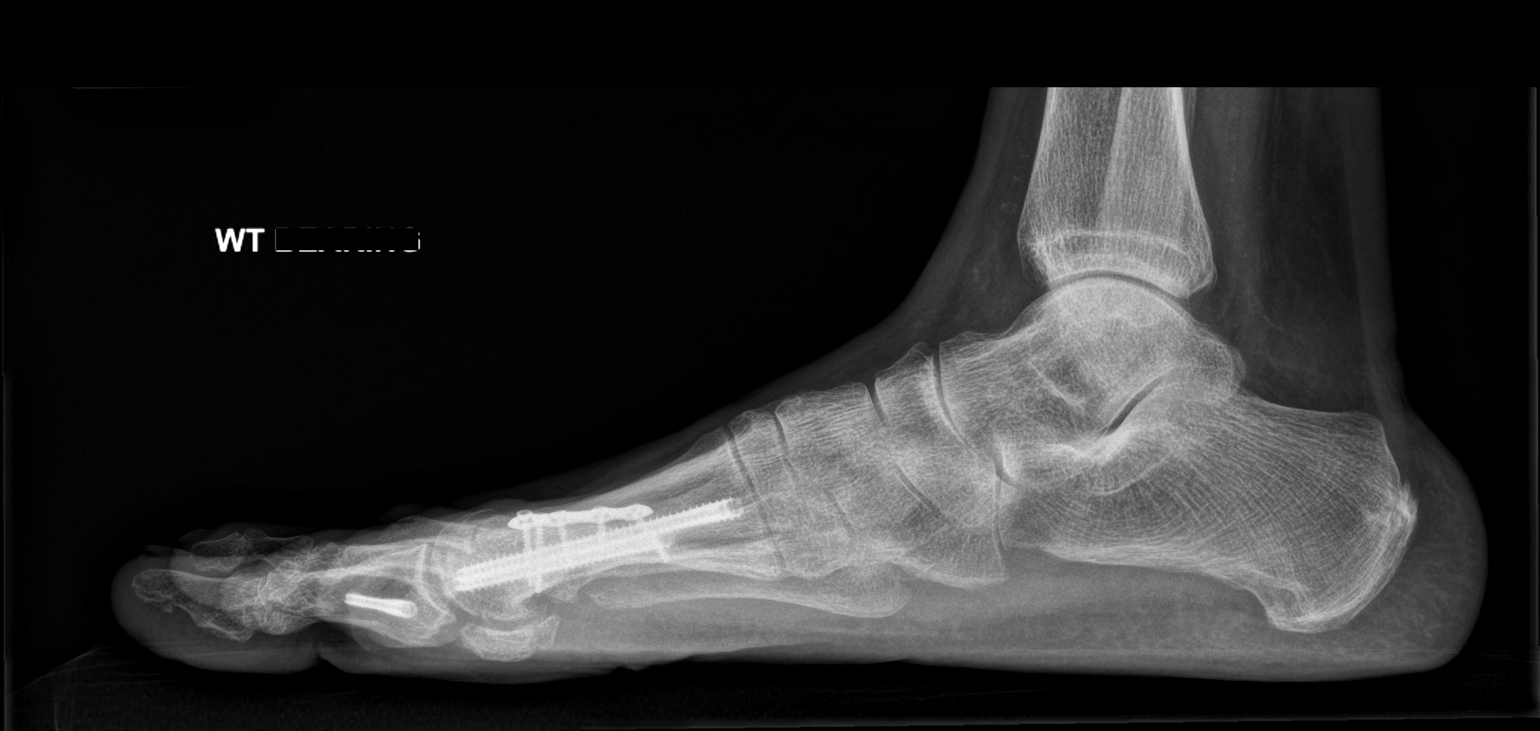
[im 2/3]
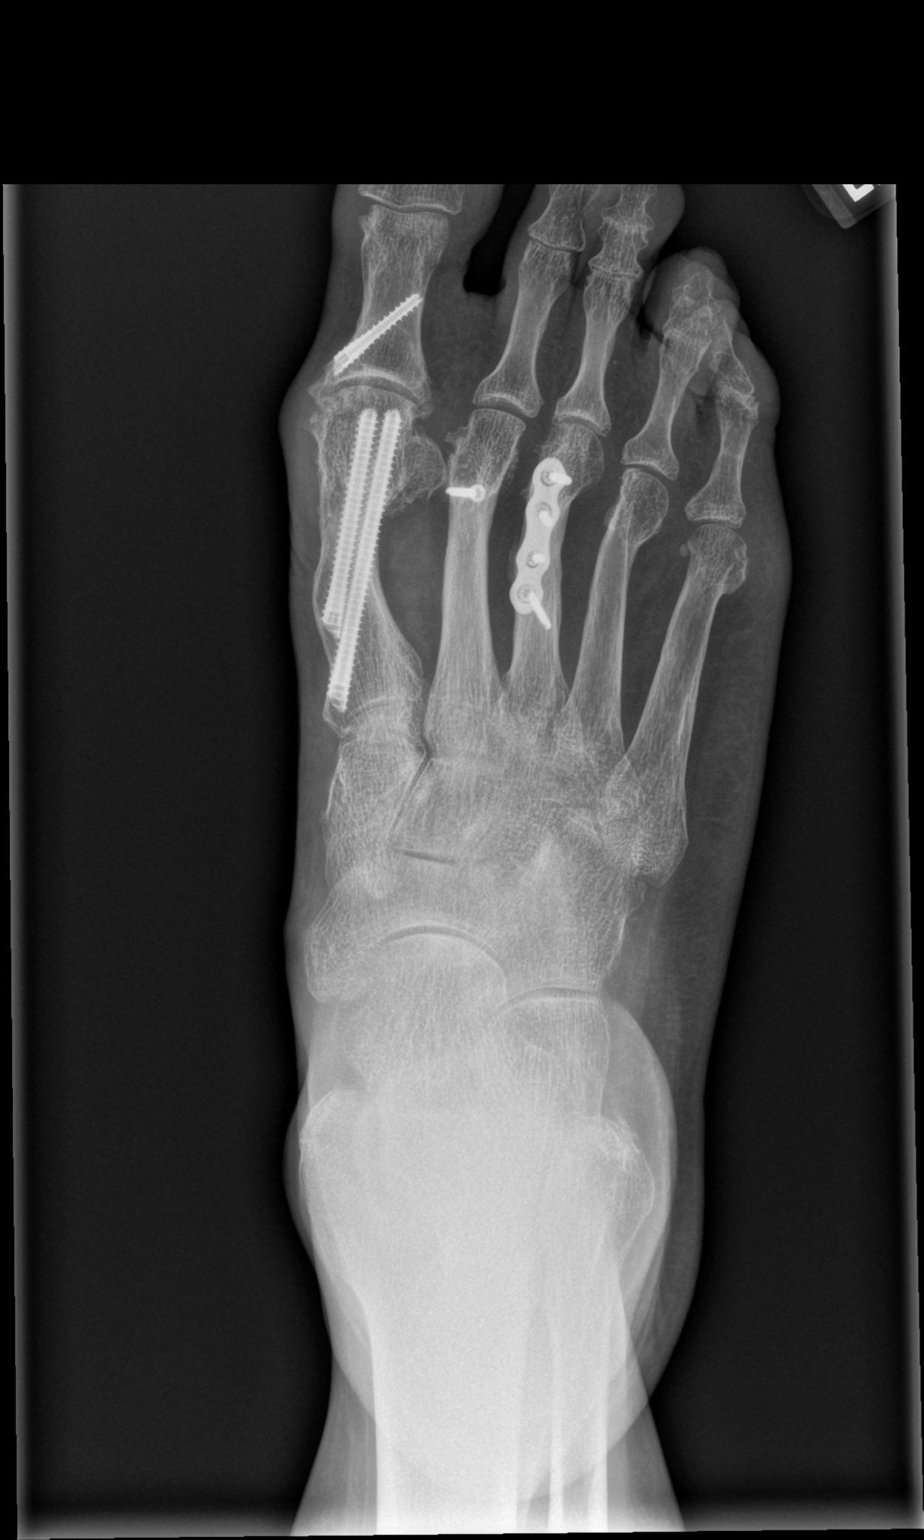
[im 3/3]
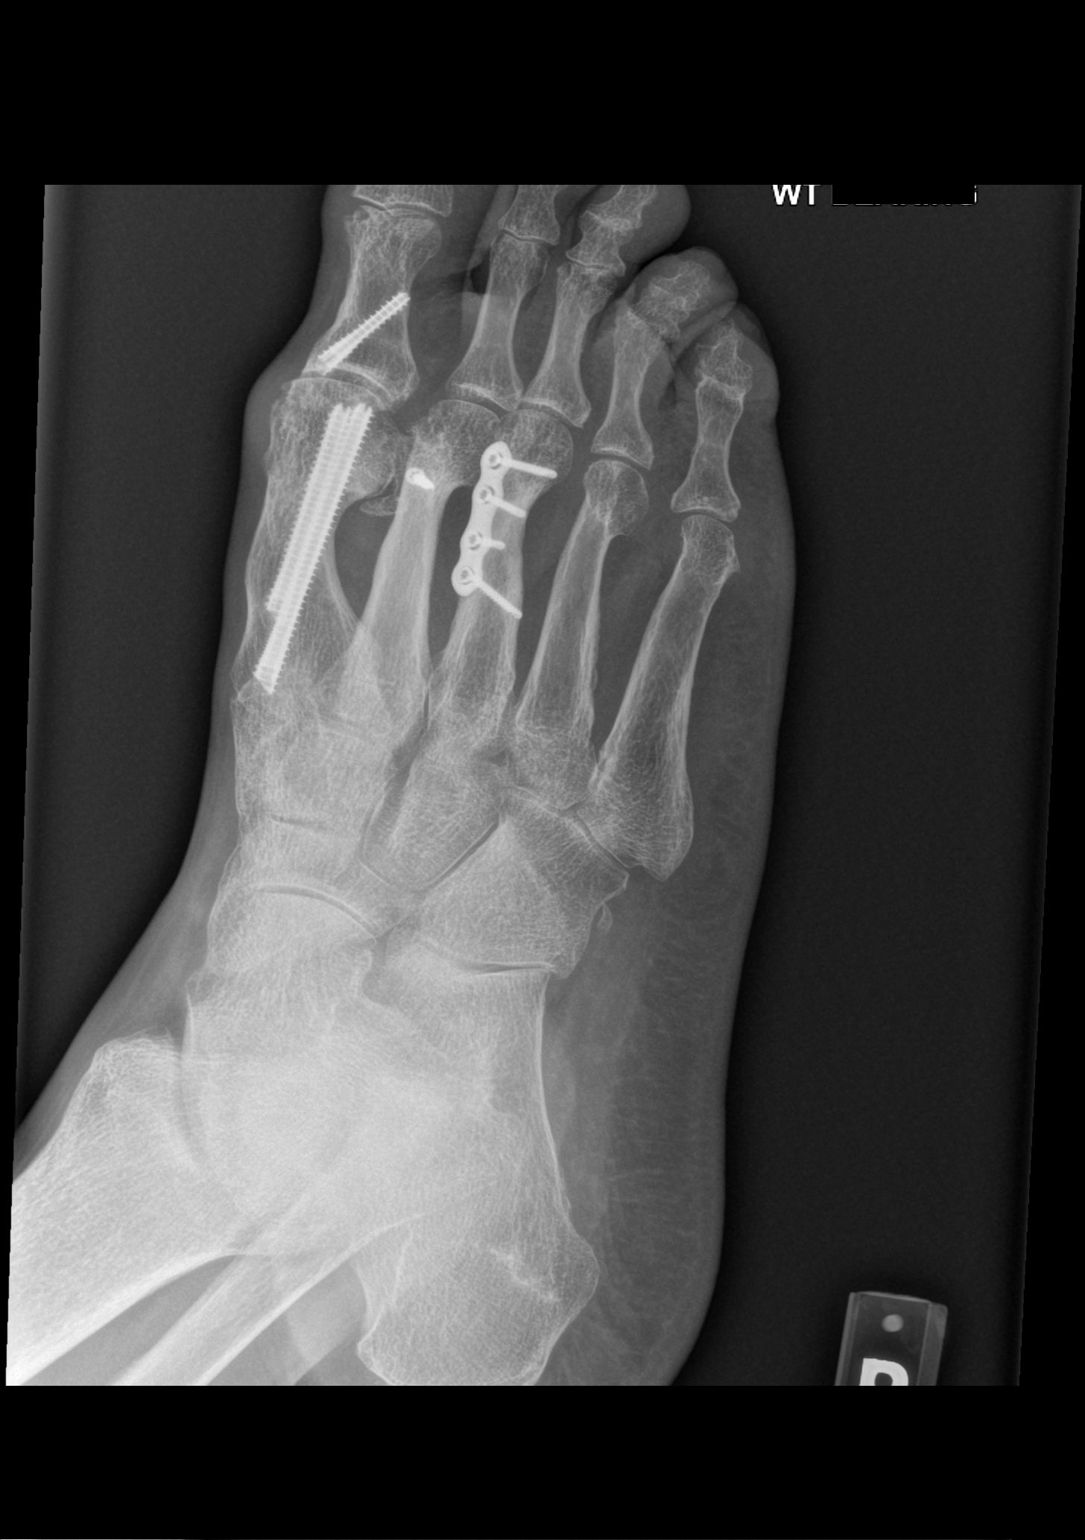

[3 of 3 positions shown; findings below may reference images not displayed]

FINDINGS: First, second and third metatarsal hardware repairs along with bunionectomy and big toe proximal phalangeal screw.

No sign of hardware loosening. Fourth metatarsal head deformity with medial flattening, likely postop change.

Pes planus. Demineralized bones. Moderate MTPJ and interphalangeal DJD. Trace Achilles spur. Slightly prominently calcified ankle level arterial plaques which could be diabetes or renal dysfunction related.
IMPRESSION: Chronic changes per above.

## 2021-06-12 IMAGING — CT CT ABD/PEL WO CONT
2 of 4 series · 12 of 46 positions shown, 14 images · non-contrast
Comparison: none

[Series 2: soft tissue · axial · 0.44mm/px · z∈[+1138,+1473]mm · 9 of 81 slices shown, 11 images]
[im 7/81  soft-tissue]
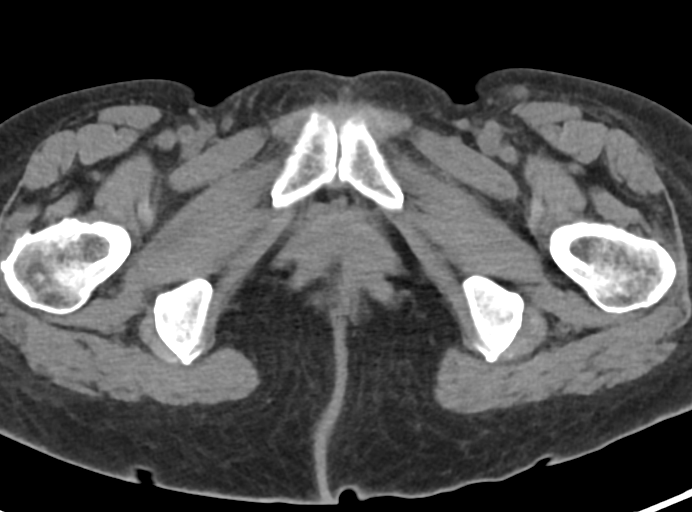
[im 7/81  bone]
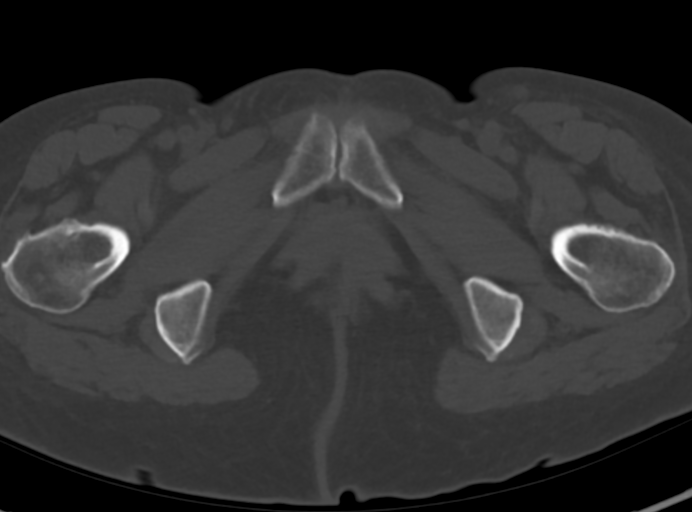
[im 17/81  soft-tissue]
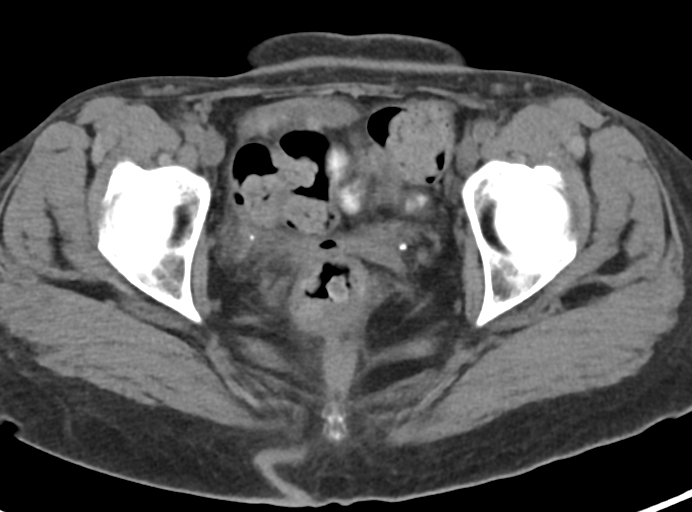
[im 23/81  soft-tissue]
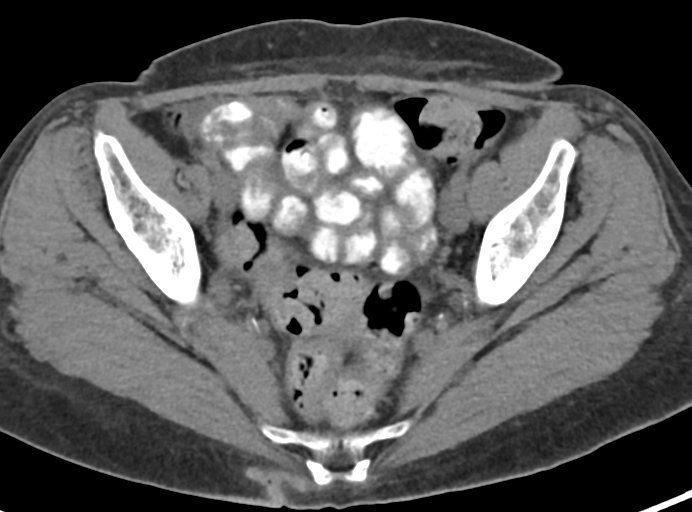
[im 33/81  soft-tissue]
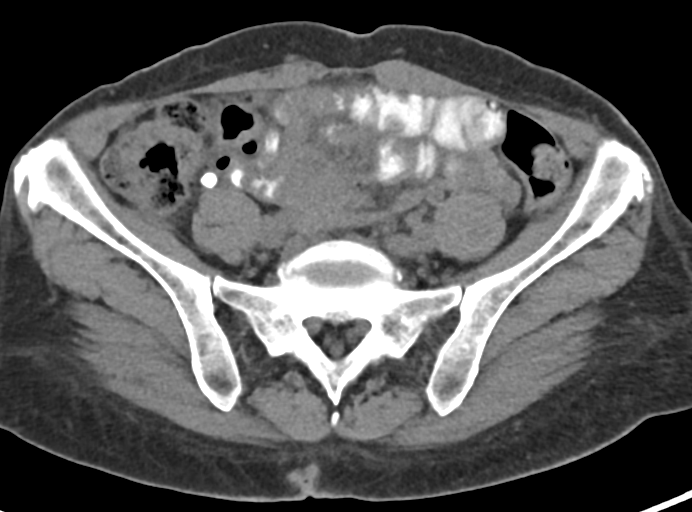
[im 42/81  soft-tissue]
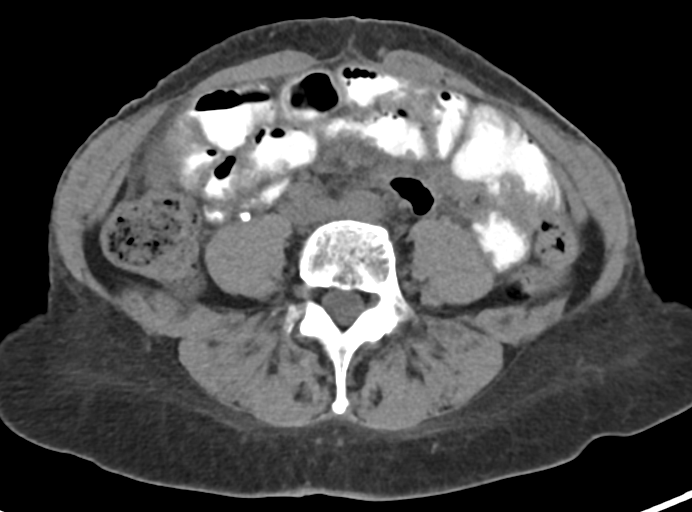
[im 49/81  soft-tissue]
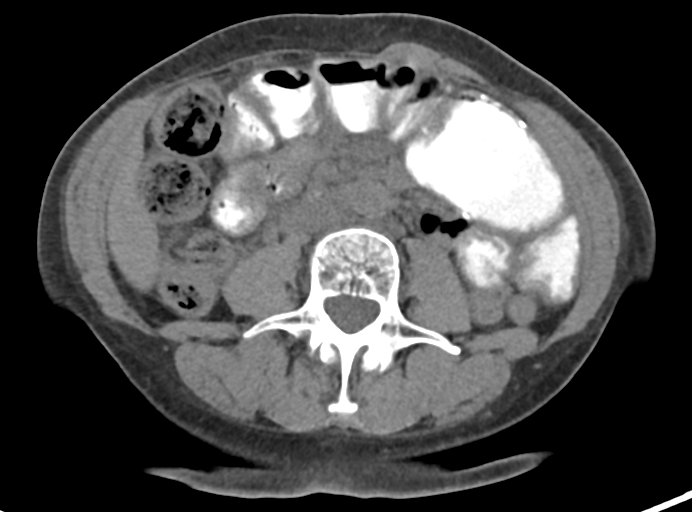
[im 58/81  soft-tissue]
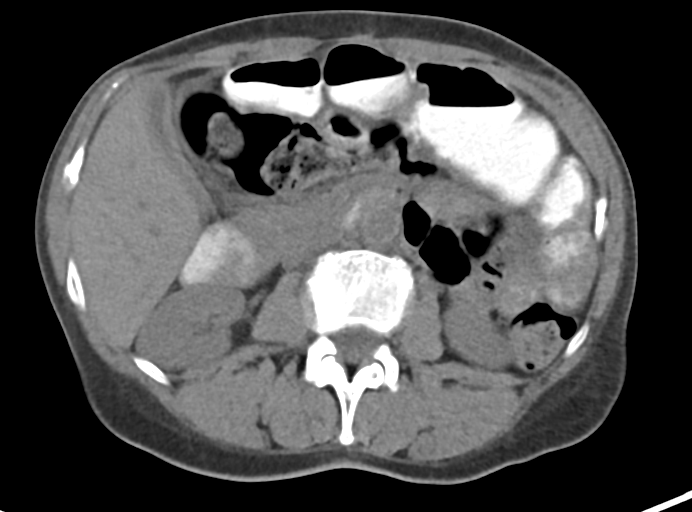
[im 68/81  soft-tissue]
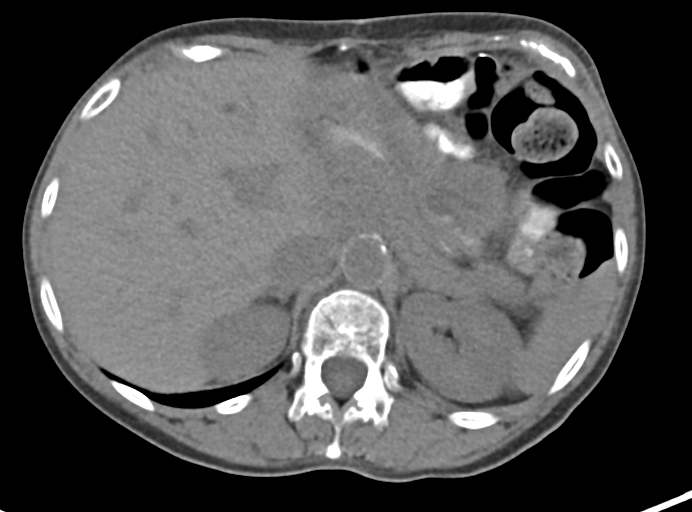
[im 74/81  soft-tissue]
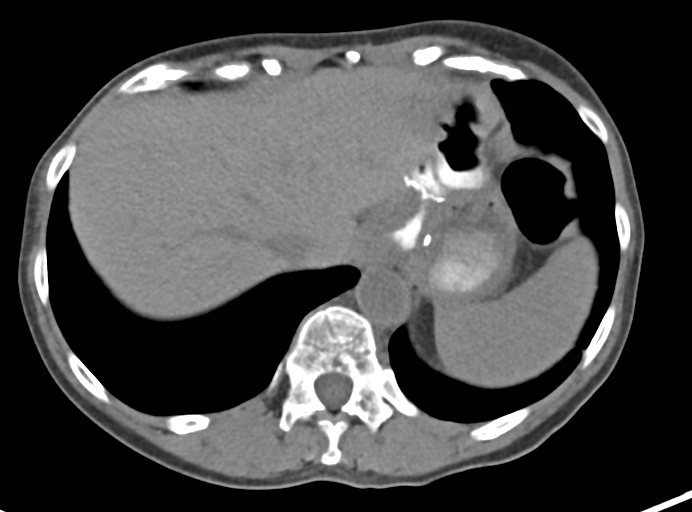
[im 74/81  bone]
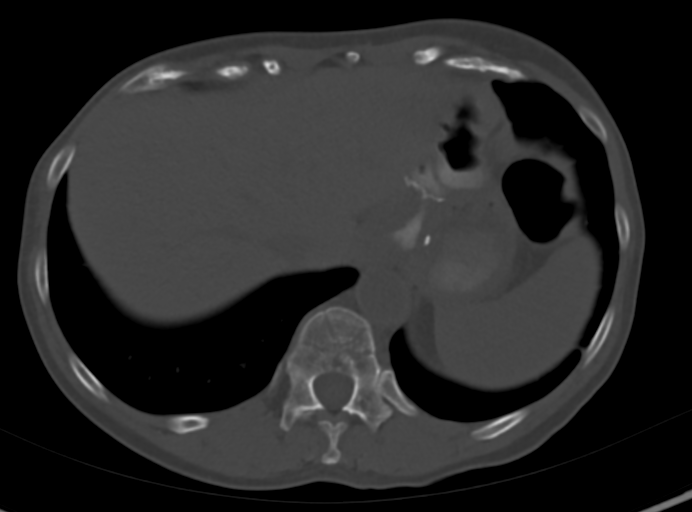

[Series 6: coronal · coronal · 0.59mm/px · 3 of 38 slices shown]
[im 13/38  soft-tissue]
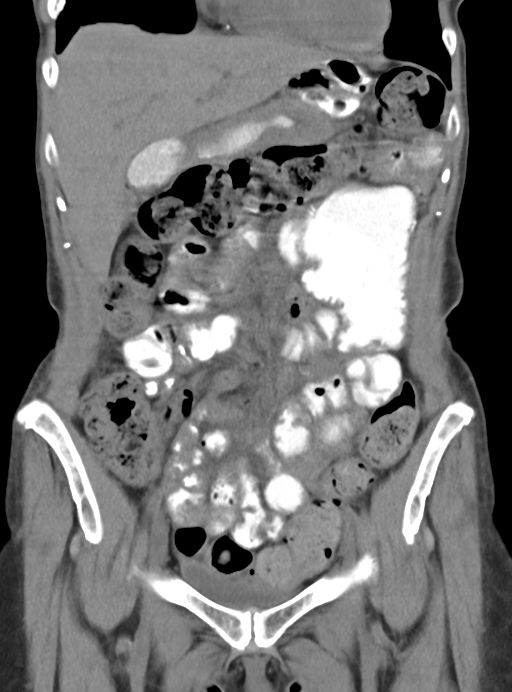
[im 17/38  soft-tissue]
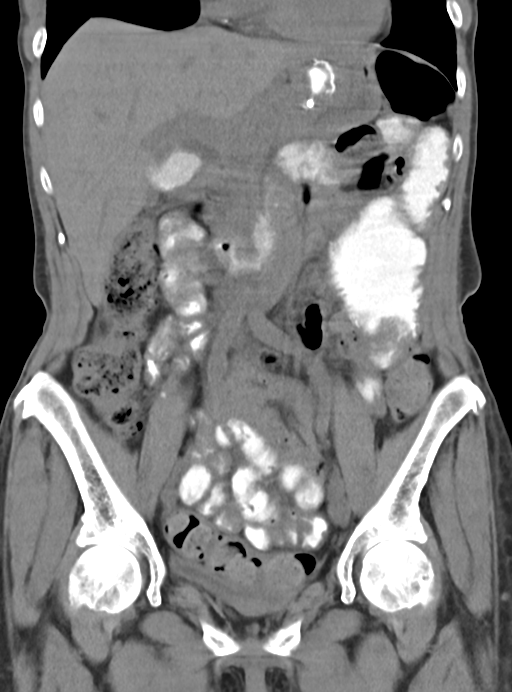
[im 21/38  soft-tissue]
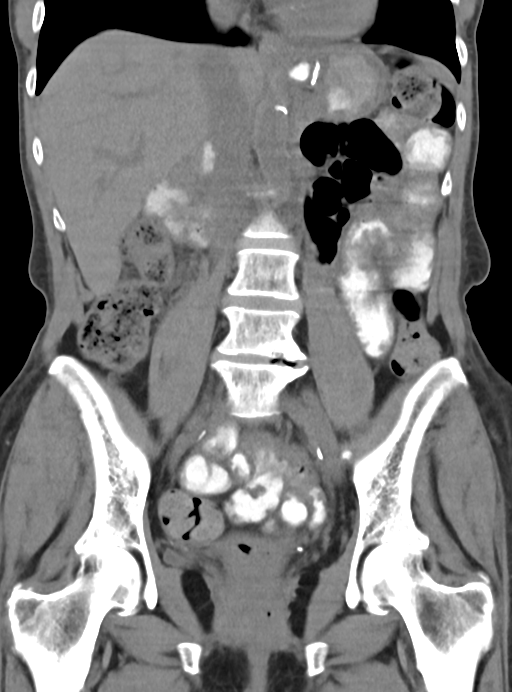

[12 of 46 positions shown; findings below may reference images not displayed]

REASON FOR EXAM

*
Lower abdominal and pelvic pain

COMPARISON

*
None

TECHNIQUE

*
CT images of the abdomen and pelvis were acquired without the administration of IV contrast

*
900 mL Readi-Cat oral contrast

*
Total radiation dose to patient is CTDIvol 7.73 mGy and DLP 337.00 mGy-cm.

*
Dose reduction technique used: Automated exposure control and adjustment of the mA and/or kV according to patient size 

*
CT count in previous 12 months:  0

FINDINGS

Liver

*
Smooth margins

Gallbladder, Pancreas, Spleen

*
Contracted gallbladder

*
No biliary dilatation

*
No inflammation about the pancreas

*
Normal size spleen

Adrenals, Kidneys, Urinary Bladder, Reproductive

*
Bilateral nephrolithiasis with minimal medullary nephrocalcinosis

*
No hydronephrosis

*
Gonadal vein calcification without compelling ureteral stone

*
Uterus not visualized

*
No pelvic organ prolapse

Gastrointestinal

*
Gastric bypass

*
Small amount of contrast in the excluded stomach may have refluxed up the biliary limb

*
Ectatic Roux-en-Y anastomosis is a common incidental finding ([DATE])

*
Oral contrast is gradually coursing through the small bowel, but has not yet reached the cecum

*
Moderate colonic stool burden

*
No bowel obstruction or inflammation

Peritoneum and Retroperitoneum

*
Atherosclerotic disease

*
No enlarged lymph nodes

*
No ascites

Bones, Superficial Tissues, and Lung Bases

*
Shallow levocurvature to the thoracolumbar spine

*
No destructive osseous lesion

*
Mild linear scarring in the lung bases

IMPRESSION

*
No acute intra-abdominal or pelvic abnormality

*
Moderate colonic stool burden without obstruction

## 2022-02-26 IMAGING — MG MAMMO SCRN BIL W/CAD TOMO
8 series · 9 of 24 positions shown · non-contrast
Comparison: The present examination has been compared to prior imaging studies.

Images Obtained from [HOSPITAL] Imaging
---------- ADDENDED REPORT ----------
The present examination has been compared to prior imaging studies performed at [facility] on 12/22/2019, at [facility] [place] on 04/12/2009, and at [facility] on 05/21/2016
and 07/25/2017.
BI-RADS Category 1: Negative
---------- ORIGINAL REPORT ----------
INDICATION: Screening.
TECHNIQUE: Bilateral 2-D digital screening mammogram was performed followed by 3-D tomosynthesis.  Current study was also evaluated with a computer aided detection (CAD) system.
MAMMOGRAM FINDINGS:
There are scattered areas of fibroglandular density.
No suspicious abnormality is seen in either breast.  There are no significant changes from the prior study.

[L CC]
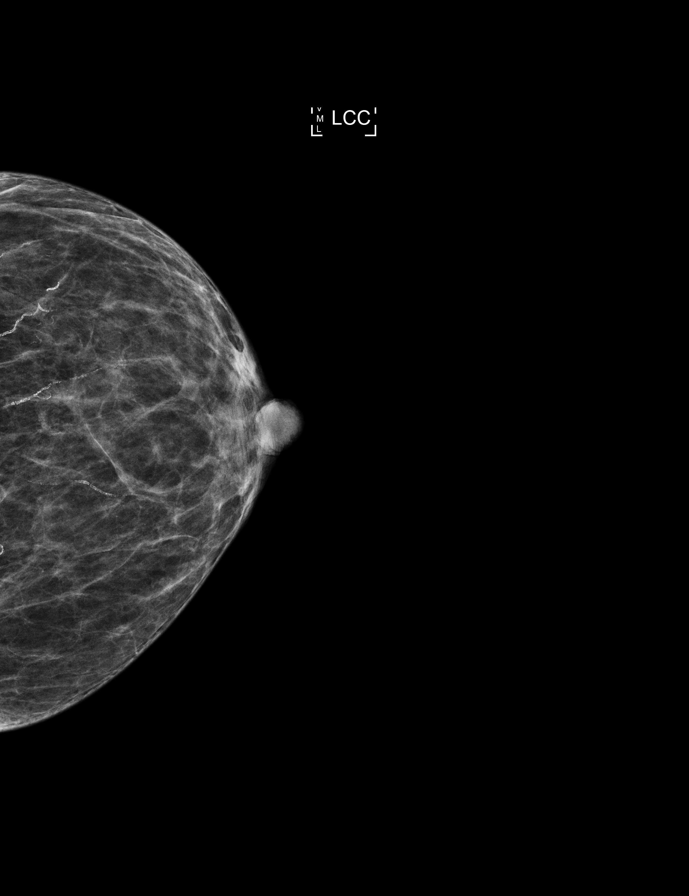

[R CC]
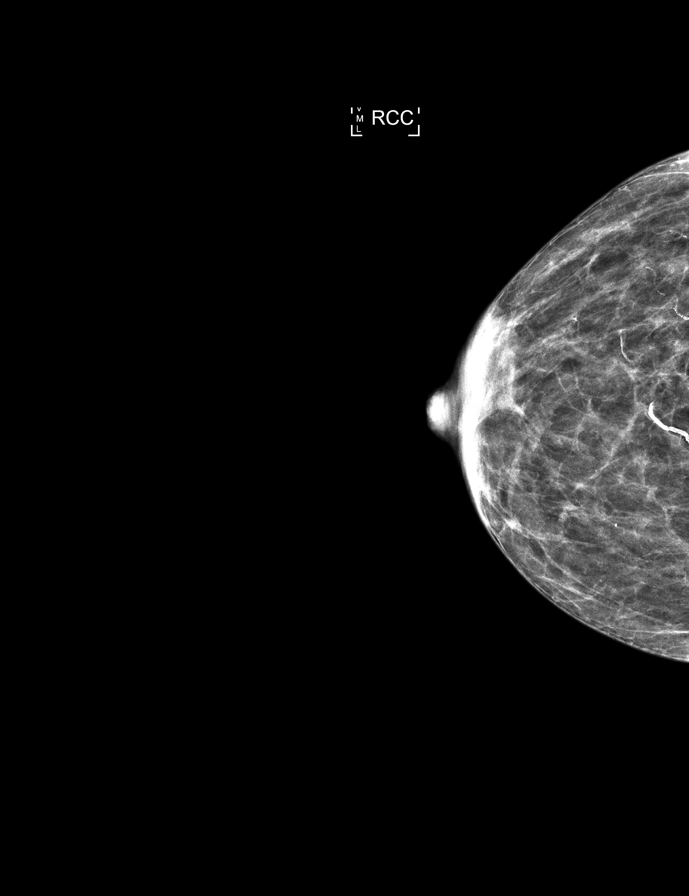

[R MLO]
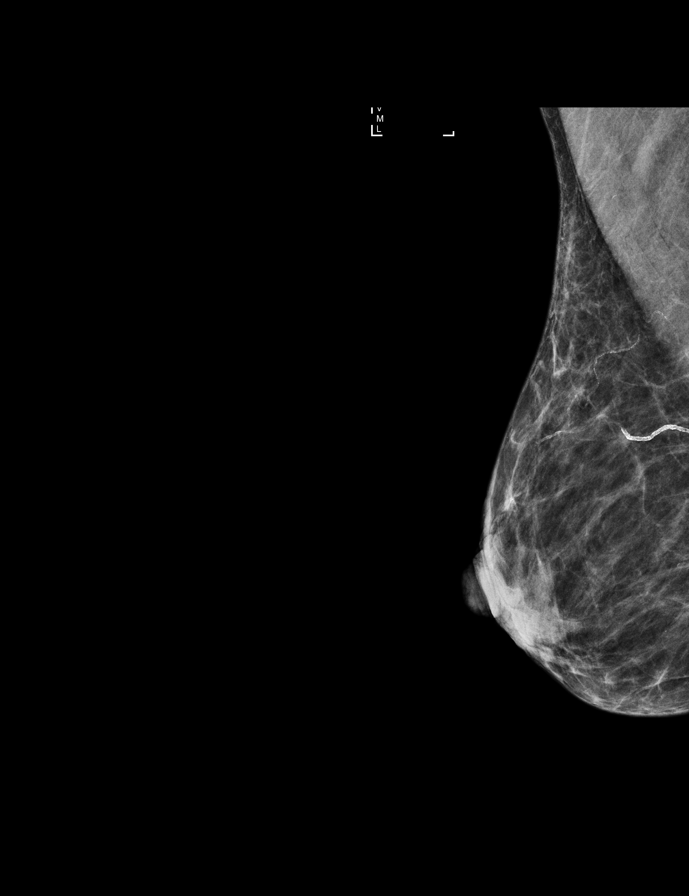

[L MLO]
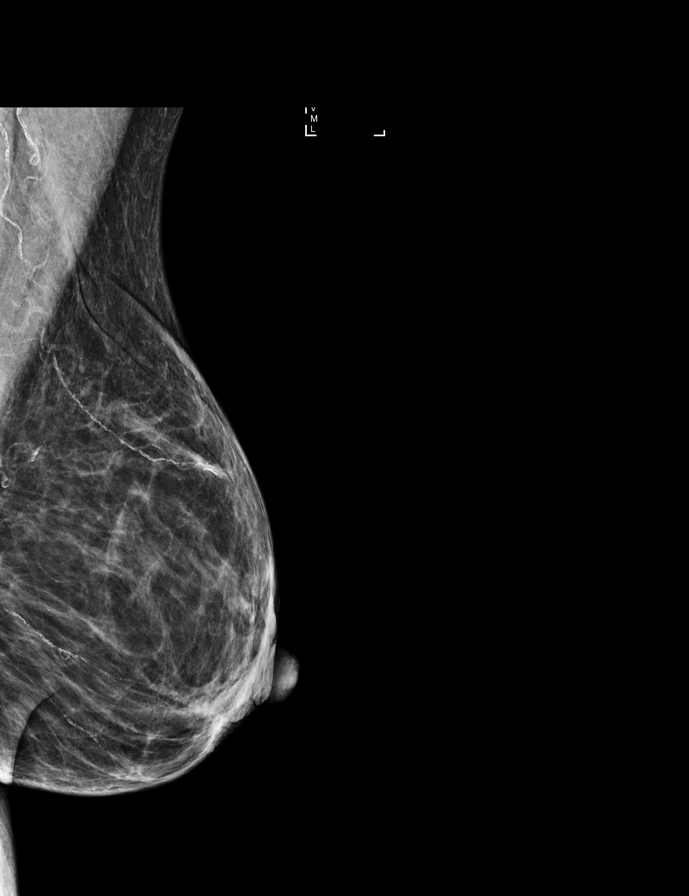

[L MLO tomo · 2 of 32 frames shown]
[frame 11/32]
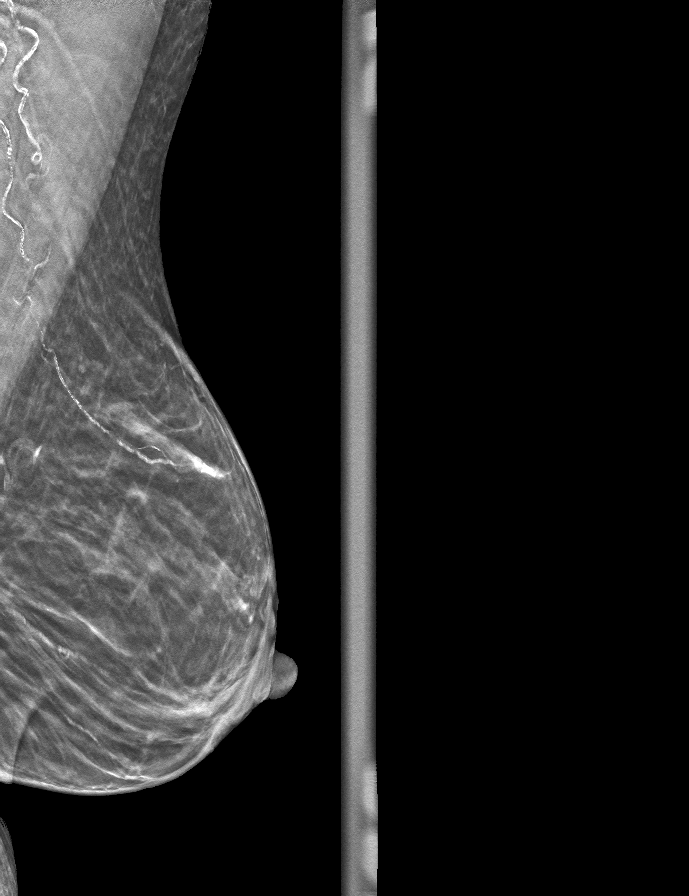
[frame 17/32]
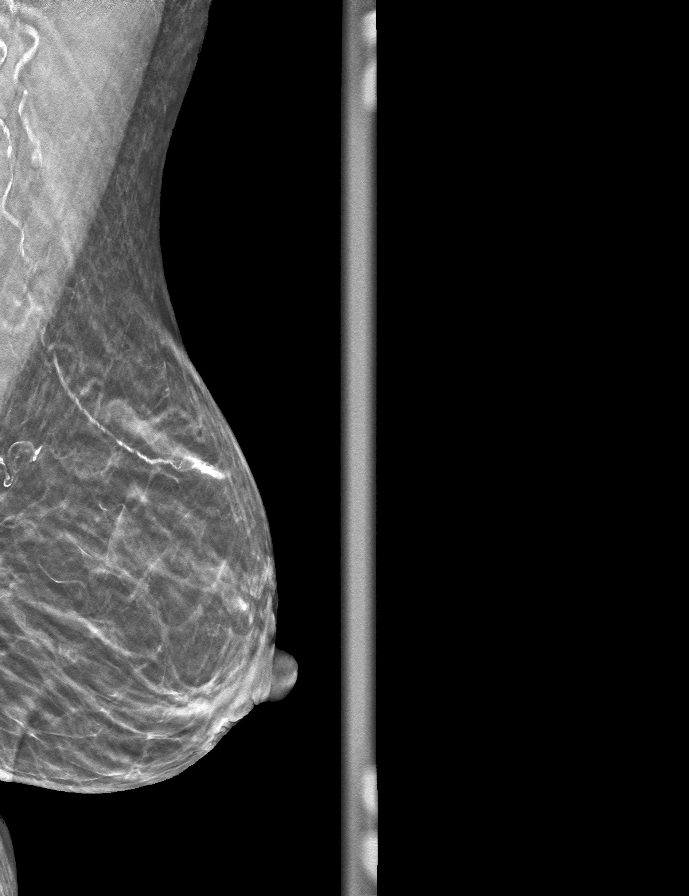

[R MLO tomo · tomo slice 15/28.0]
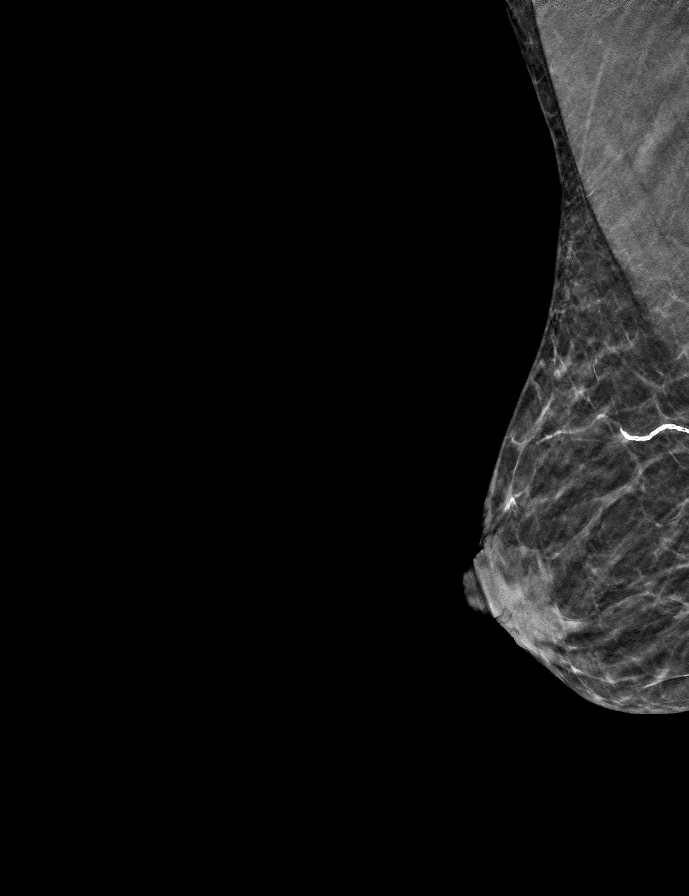

[R CC tomo · tomo slice 13/24.0]
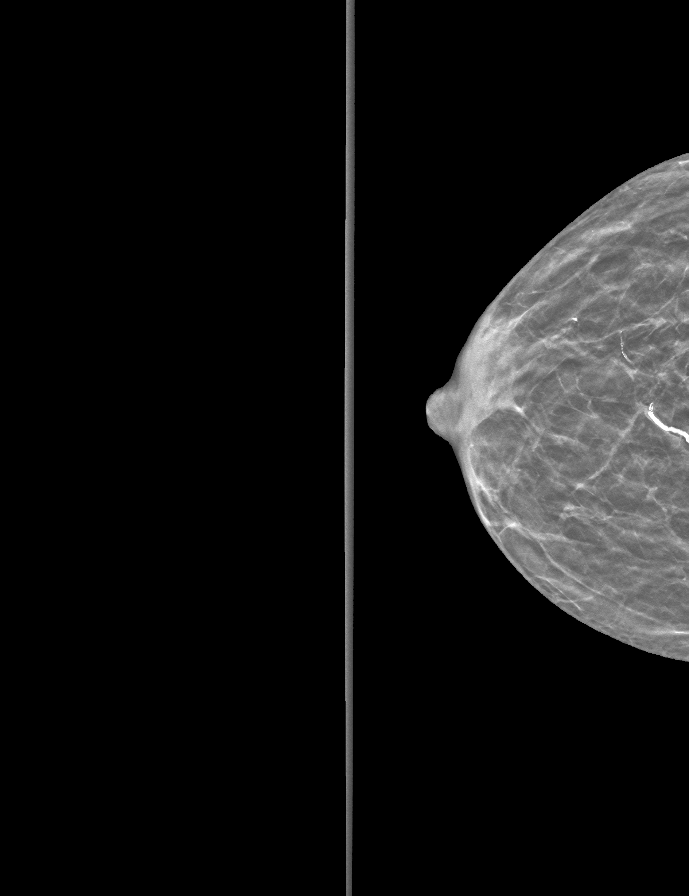

[L CC tomo · tomo slice 15/30.0]
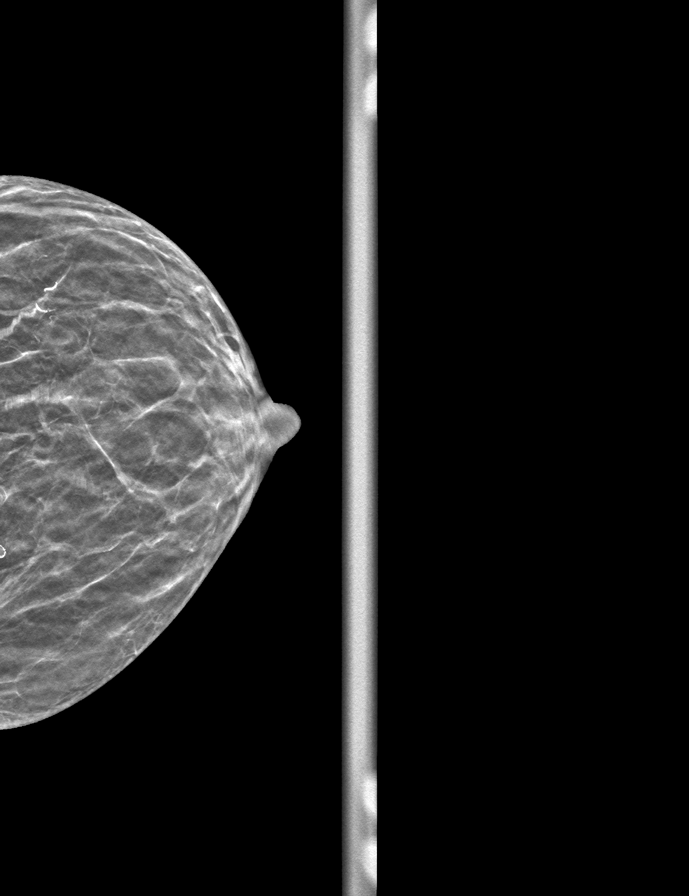

[9 of 24 positions shown; findings below may reference images not displayed]

IMPRESSION: There is no mammographic evidence of malignancy.
Screening mammogram recommended in 1 year.
BI-RADS Category 1: Negative

## 2022-04-09 IMAGING — OT DXA BONE DENSITY
2 series · 2 of 2 positions shown · non-contrast
Comparison: none

Images Obtained from [HOSPITAL] Imaging
REASON FOR EXAM: Post-menopausal screening.
RISK FACTORS:  Rheumatoid arthritis. Low body weight (less than 127 pounds).
PRIOR EXAMS:  None.
METHOD:  Scans of the lumbar spine and left hip were performed using dual energy X-ray densitometry (DXA)

[Series 1: — · 1 of 1 slices shown (1 of 2)]
[im 1/1]
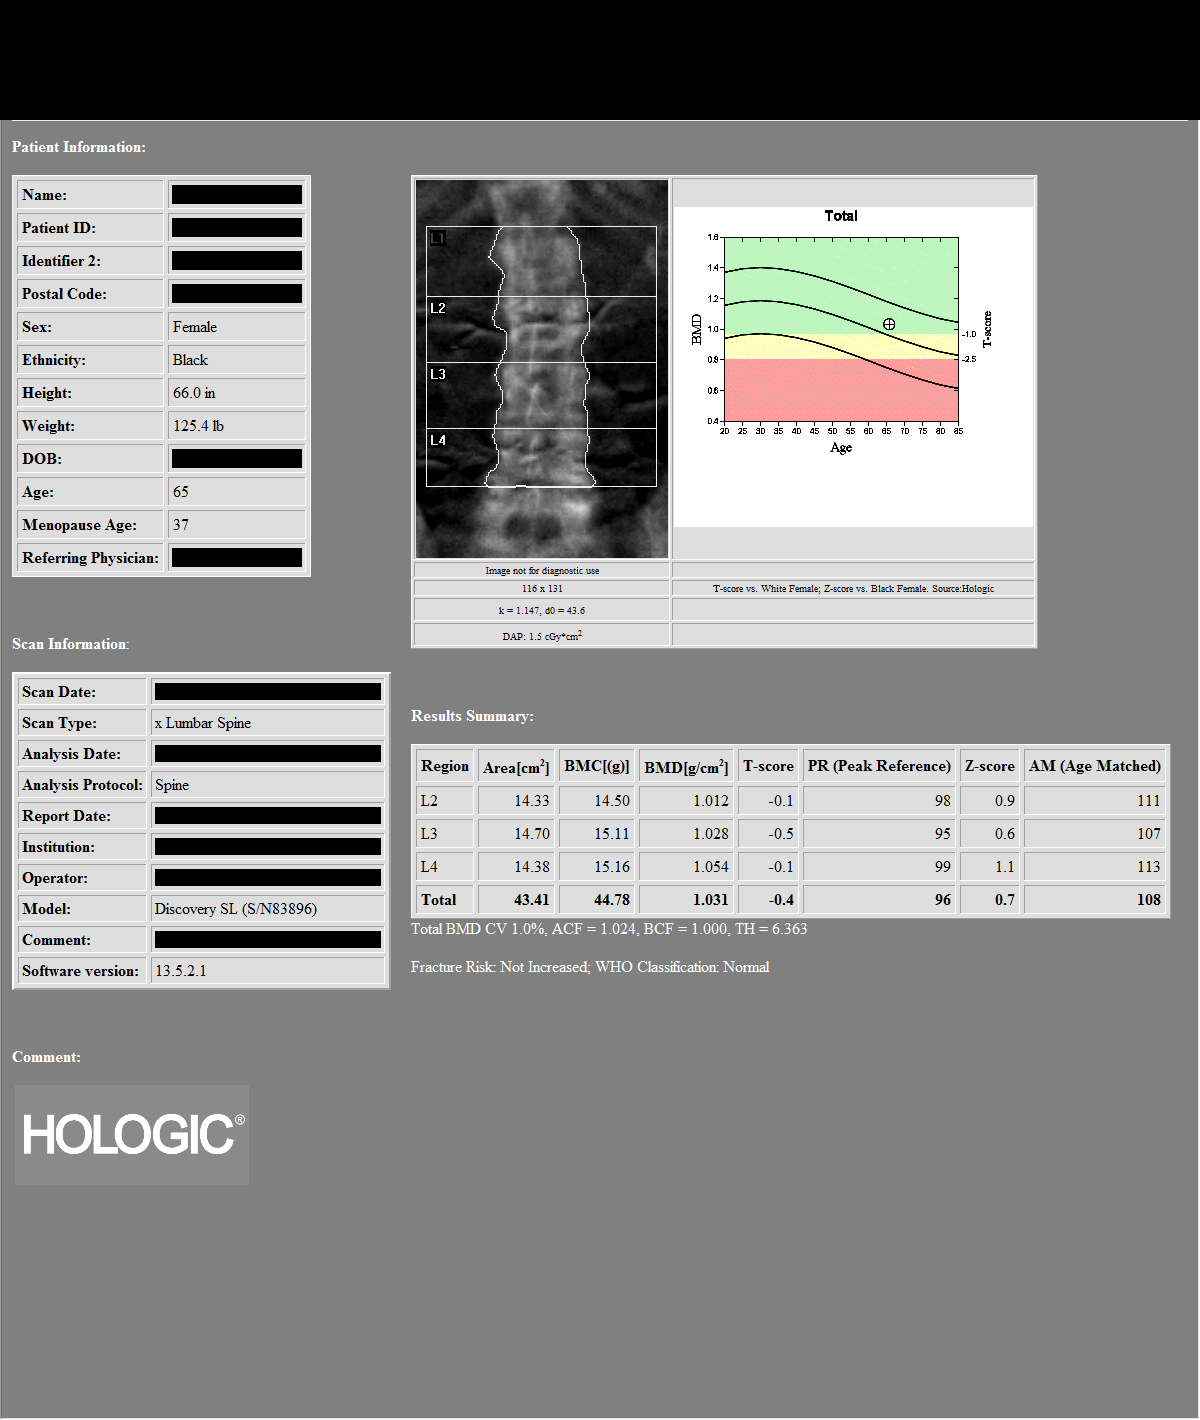

[Series 2: — · left · 1 of 1 slices shown (2 of 2)]
[im 1/1]
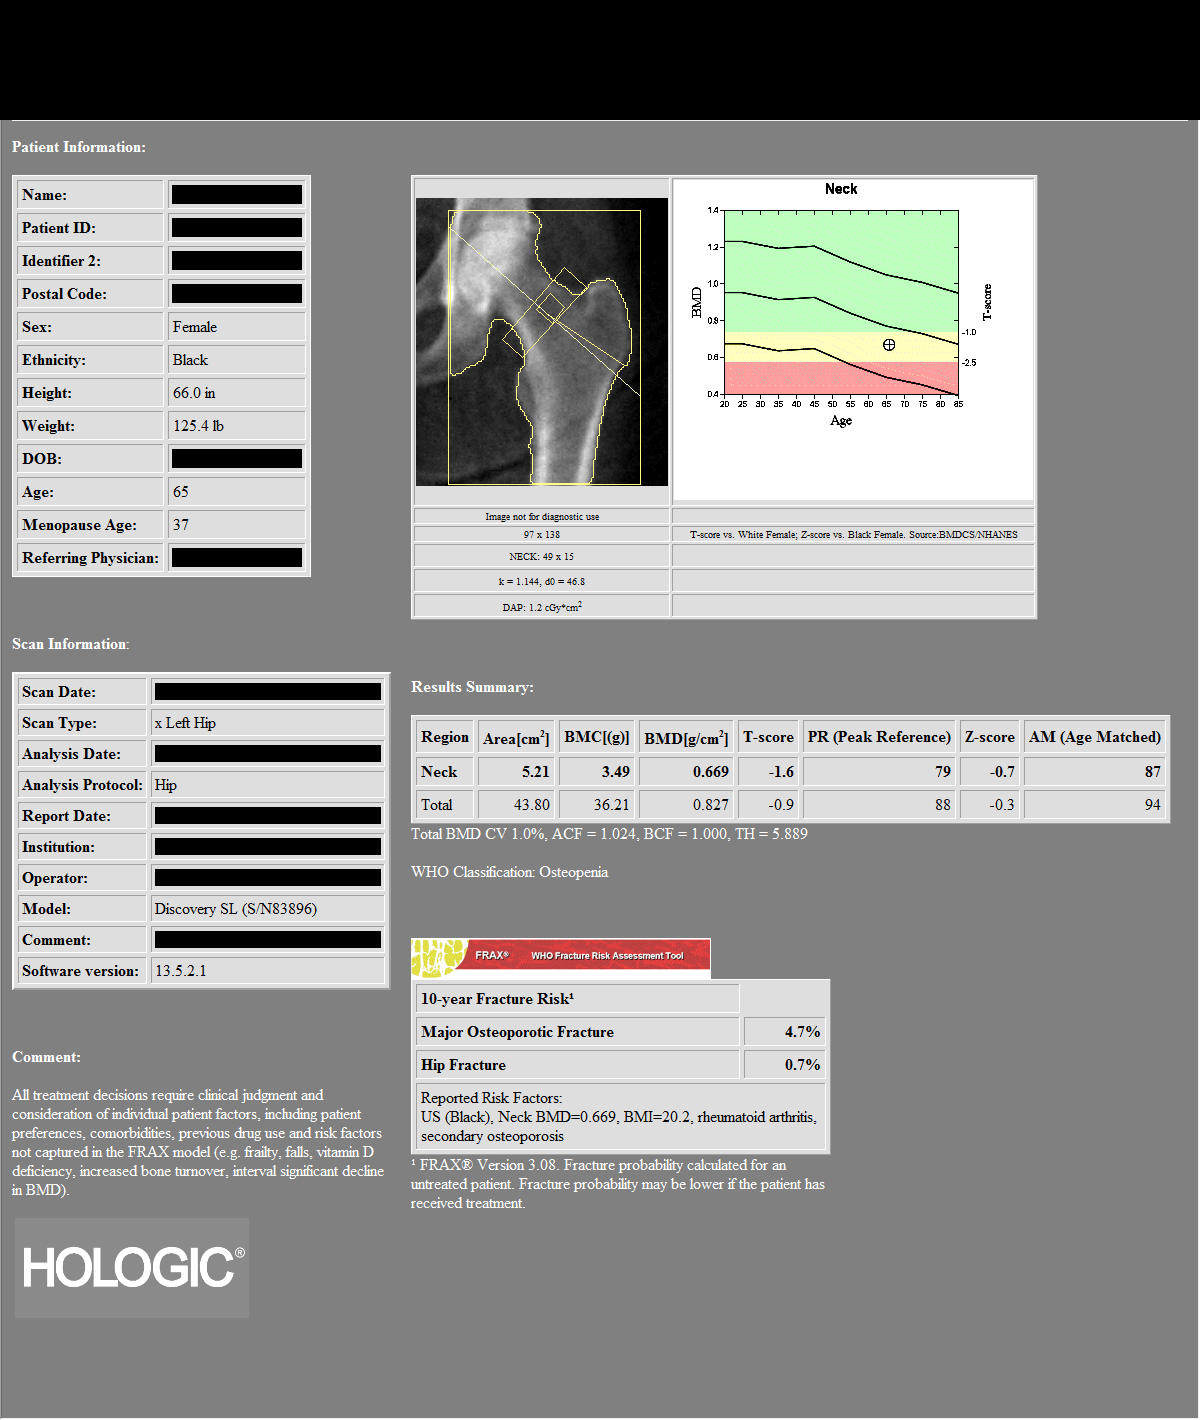

[2 of 2 positions shown; findings below may reference images not displayed]

IMPRESSION: As defined by World Health Organization, the patient meets the criteria for OSTEOPENIA based on left hip T-score.
PATIENT DEMOGRAPHICS:  65-year-old black female.
FINDINGS: 1.    Review of scanogram images shows no factor invalidating scan results.
2.    The lumbar spine exam using L2-L4 regions shows average Bone Mineral Density is 1.031 gm/cm2 of Hydroxyapatite.  The T-score (comparing patient with a young adult group) is 0.4 standard
deviations BELOW mean. The Z-score (comparing patient with an age-matched group) is 0.7 standard deviations ABOVE mean.
3.  The left hip exam using femoral neck region of interest shows average Bone Mineral Density is 0.669 gm/cm2 of Hydroxyapatite. The T-score (comparing patient with a young adult group) is
standard deviations BELOW mean. The Z-score (comparing patient with an age-matched group) is 0.7 standard deviations BELOW mean.
According to the World Health Organization risk assessment tool (FRAX) for osteopenia only, which uses the femoral neck T-score and includes other patient risk factors for fracture, the patient has a
10-year absolute risk of hip fracture of 0.7% and 10-year absolute risk fracture for any major fracture of 4.7%.
FRAX data is not applicable for those patients with normal bone density, osteoporosis or undergoing medical therapy.
Clinician's judgment and/or patient preferences may indicate treatment for people with 10-year fracture probabilities above or below these levels.
RECOMMENDATIONS:  The patient states that she is taking vitamin D supplements on a regular basis.  The patient should continue being a non-smoker and regular exercise to patient tolerance would be of
benefit.  The patient is currently not taking prescribed medication for prevention of bone loss.
According to criteria established by the National Osteoporosis Foundation, the patient DOES NOT meet the?current indications for prescribed?medical therapy.
The National Osteoporosis Foundation now recommends followup DXA scanning every two years in patients at risk regardless of whether the patient is undergoing pharmacological treatment.

## 2022-05-07 IMAGING — MR MRI BRAIN W/WO CONTRAST
11 series · 48 of 48 positions shown · IV contrast (prohance)
Comparison: None.

Images Obtained from [HOSPITAL] Imaging
HISTORY: 65 year-old female with bilateral hearing loss started one year ago, blackout spells for about 4 years, dizziness.
TECHNIQUE: MRI study of the brain was performed using IAC protocol. Sagittal, coronal, axial and coronal images of varying sequences were obtained. The examination was performed first without
contrast followed by IV administration of 15 mL ProHance.

[Series 5: t1_sag · sagittal · B · 5.0mm · 0.75mm/px · 4 of 25 slices shown]
[im 1/25]
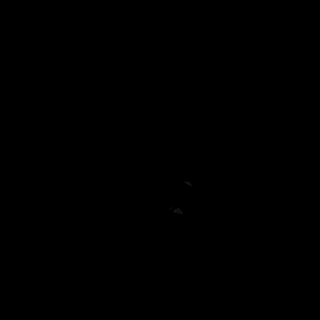
[im 9/25]
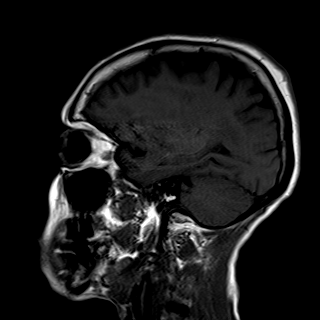
[im 17/25]
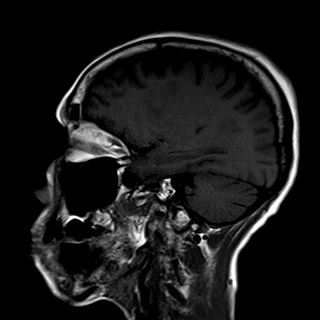
[im 25/25]
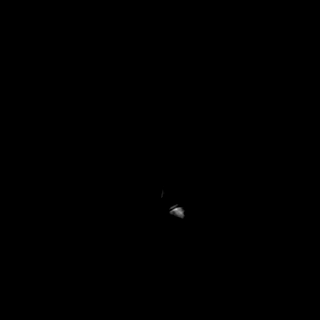

[Series 6: flair_axial_fs · axial · B · 4.0mm · 0.90mm/px · z∈[-61,+84]mm · 4 of 30 slices shown]
[im 1/30]
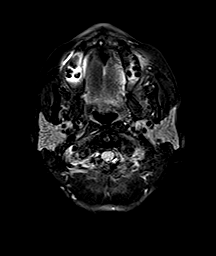
[im 10/30]
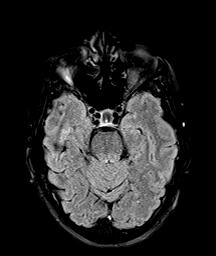
[im 20/30]
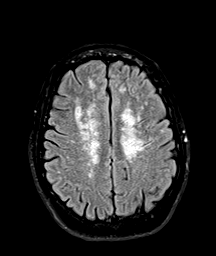
[im 30/30]
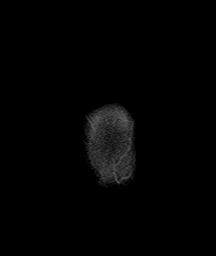

[Series 7: DWI · axial · B · 4.0mm · 1.31mm/px · z∈[-59,+86]mm · 4 of 30 slices shown (1 of 2)]
[im 1/30]
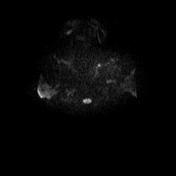
[im 10/30]
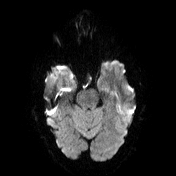
[im 20/30]
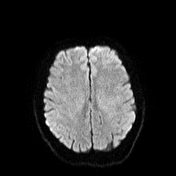
[im 30/30]
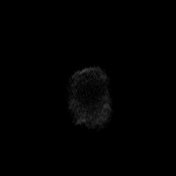

[Series 8: DWI · axial · B · 4.0mm · 1.31mm/px · z∈[-59,+86]mm · 3 of 30 slices shown (2 of 2)]
[im 1/30]
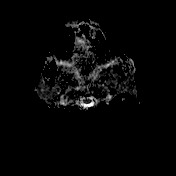
[im 15/30]
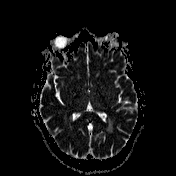
[im 30/30]
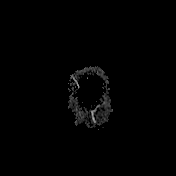

[Series 9: flash_axial · axial · B · 4.0mm · 0.90mm/px · z∈[-61,+84]mm · 3 of 30 slices shown]
[im 1/30]
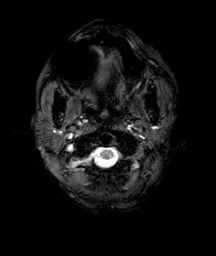
[im 15/30]
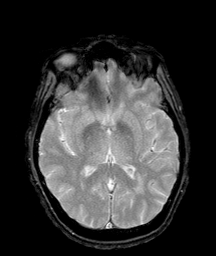
[im 30/30]
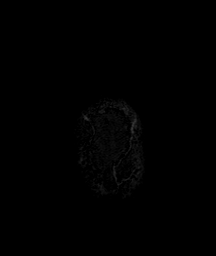

[Series 10: t1_axial · axial · B · 4.0mm · 0.72mm/px · z∈[-60,+85]mm · 3 of 30 slices shown]
[im 1/30]
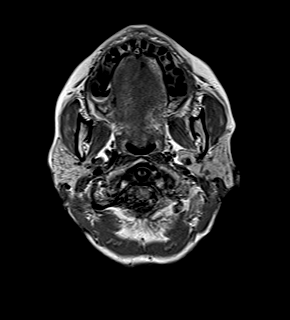
[im 15/30]
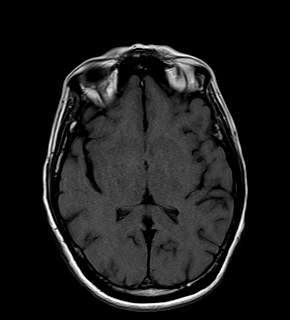
[im 30/30]
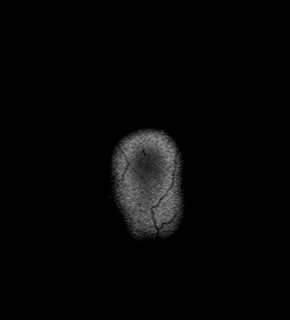

[Series 11: t2_ci3d_axial · axial · B · 0.9mm · 0.59mm/px · z∈[-51,-8]mm · 6 of 52 slices shown]
[im 1/52]
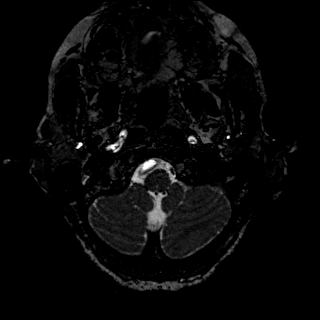
[im 11/52]
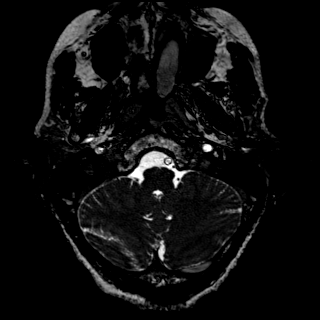
[im 21/52]
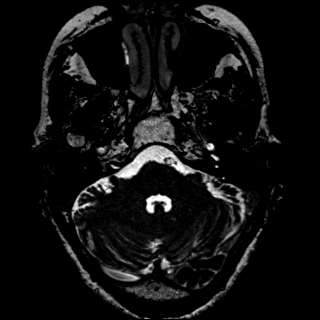
[im 31/52]
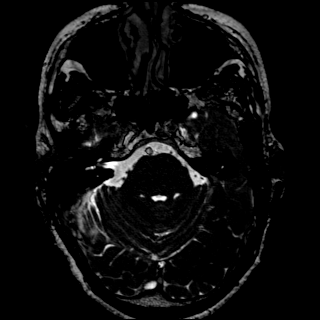
[im 41/52]
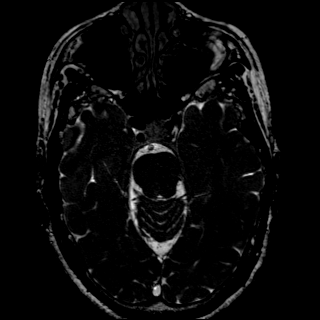
[im 52/52]
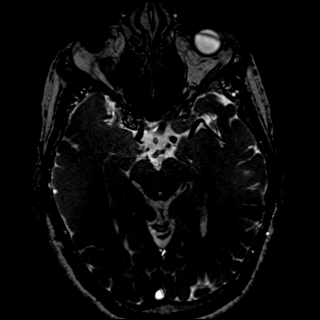

[Series 12: t1_vibe_axial_fs · axial · B · 1.0mm · 0.70mm/px · z∈[-54,-4]mm · 6 of 56 slices shown]
[im 1/56]
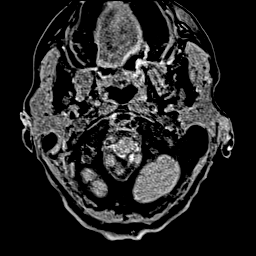
[im 12/56]
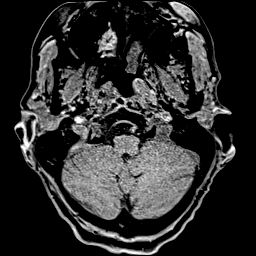
[im 23/56]
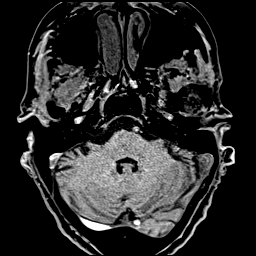
[im 34/56]
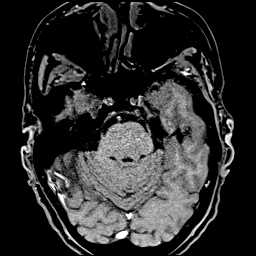
[im 45/56]
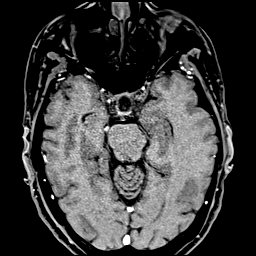
[im 56/56]
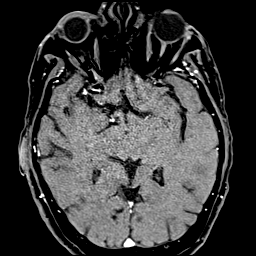

[Series 13: t1_vibe_axial_fs_+c · axial · B · 1.0mm · 0.70mm/px · z∈[-54,-4]mm · 6 of 56 slices shown]
[im 1/56]
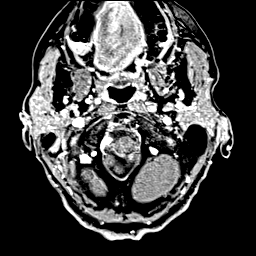
[im 12/56]
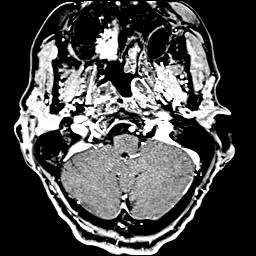
[im 23/56]
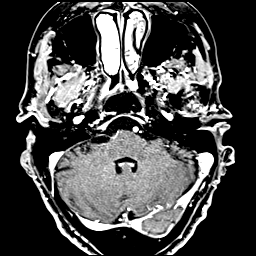
[im 34/56]
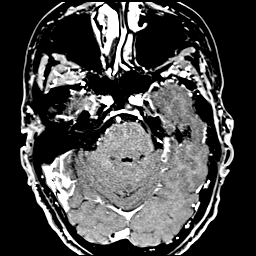
[im 45/56]
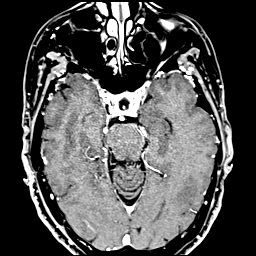
[im 56/56]
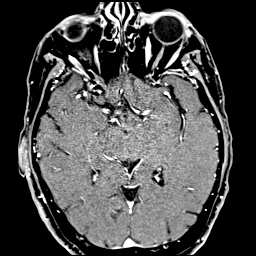

[Series 14: t1_vibe_axial_fs_+c_mpr_cor · coronal · B · 1.0mm · 0.70mm/px · 6 of 55 slices shown]
[im 1/55]
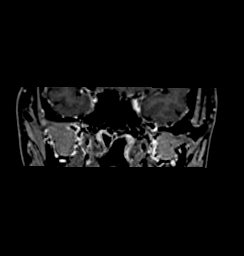
[im 11/55]
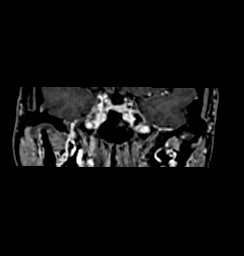
[im 22/55]
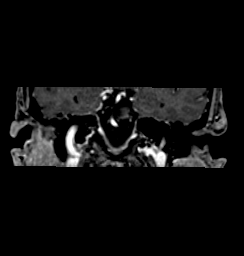
[im 33/55]
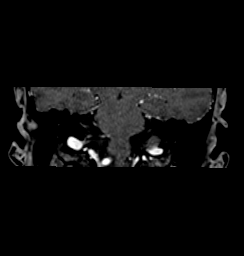
[im 44/55]
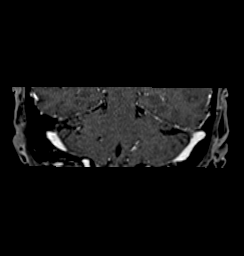
[im 55/55]
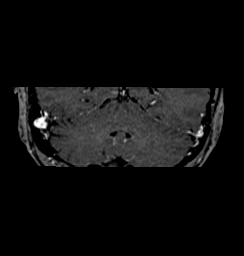

[Series 15: t1_axial_+c · axial · B · 4.0mm · 0.72mm/px · z∈[-60,+84]mm · 3 of 30 slices shown]
[im 1/30]
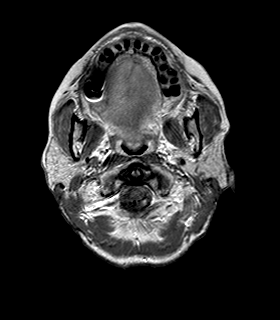
[im 15/30]
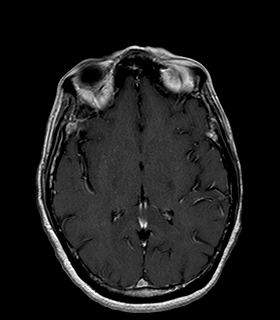
[im 30/30]
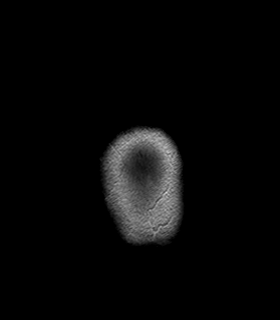

[48 of 48 positions shown; findings below may reference images not displayed]

FINDINGS: Ventricles and sulci: The ventricles and sulci are within normal limits.
Gray/white matter: Extensive supratentorial white matter T2/FLAIR hyperdensity are nonspecific but statistically most likely to be related to chronic microangiopathy changes.
Brainstem, cerebellum and midline structures: The brainstem, cerebellum and the midline structures are within normal limits.
Flow voids: Flow voids are demonstrated in the major intracranial vessels.
Sinuses and mastoid air cells: The sinuses and the mastoid air cells are within normal limits.
Globes: The globes are unremarkable.
Thin section high-resolution images through the skull base demonstrate normal signal in bilateral VII and VIII nerve complexes. The Meckel's caves are within normal limits. The pituitary gland and
cavernous sinuses are unremarkable.
There is no MR evidence of acute infarct, hemorrhage or enhancing intracranial lesion in the visualized portion of the brain.
IMPRESSION: 1.  The internal acoustic canals, bilateral VII and VIII nerve complexes are within normal limits.
2.  Extensive supratentorial white matter T2/FLAIR hyperdensity are nonspecific but statistically most likely to be related to chronic microangiopathy changes.
3.  No MR evidence of acute infarct, hemorrhage or enhancing intracranial lesion in the visualized portion of the brain.

## 2022-05-07 IMAGING — CT CT PELVIS W CONTRAST
2 of 3 series · 11 of 46 positions shown, 12 images · non-contrast
Comparison: 06/12/2021
TECHNIQUE AND FINDINGS: Axial CT images were obtained to the pelvis after oral and IV contrast, 100 mL of Usovue-1OD.

Images Obtained from [HOSPITAL] Imaging
REASON FOR EXAM: Pain, pelvic and perineal. History of hysterectomy with bilateral salpingo-oophorectomy.

[Series 2: soft tissue · axial · 0.45mm/px · z∈[+1034,+1269]mm · 8 of 55 slices shown, 9 images]
[im 4/55  soft-tissue]
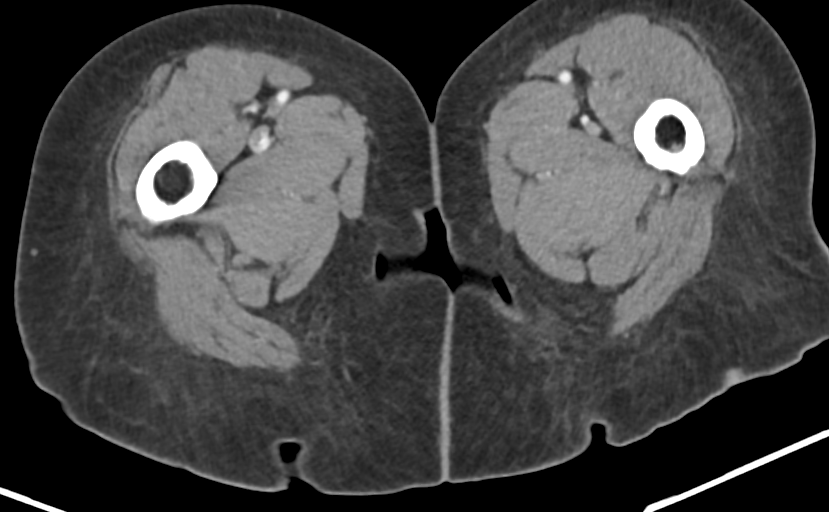
[im 4/55  bone]
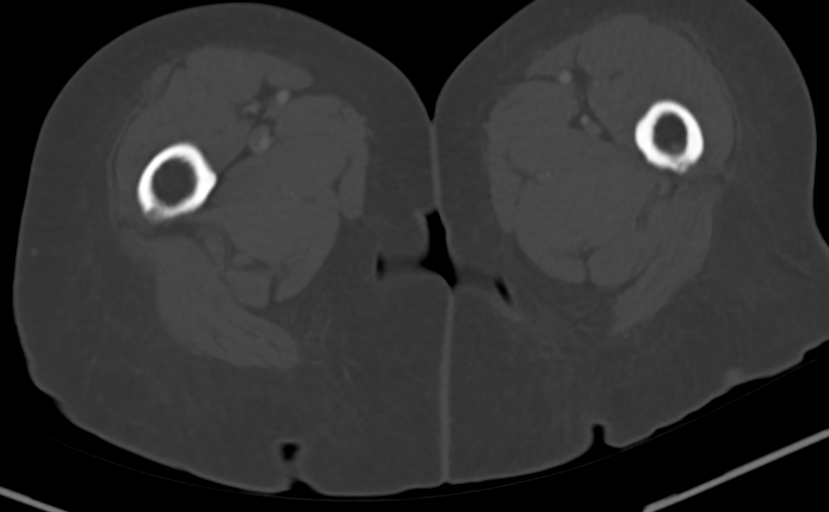
[im 11/55  soft-tissue]
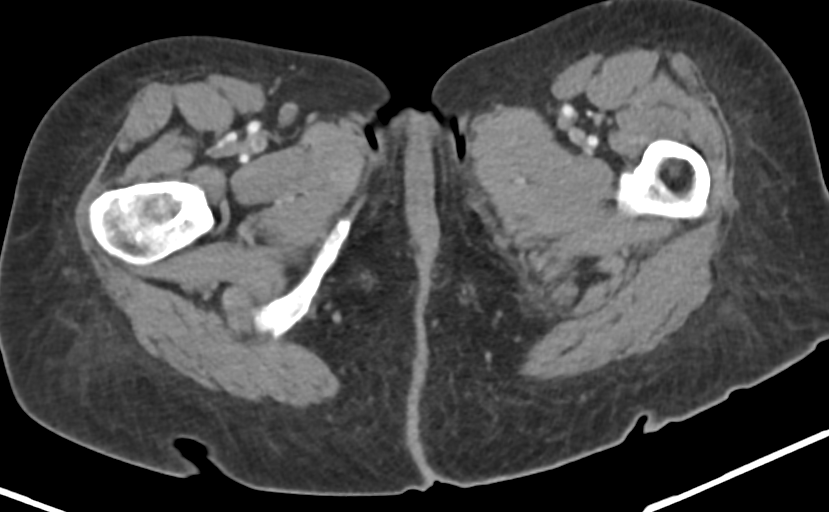
[im 18/55  soft-tissue]
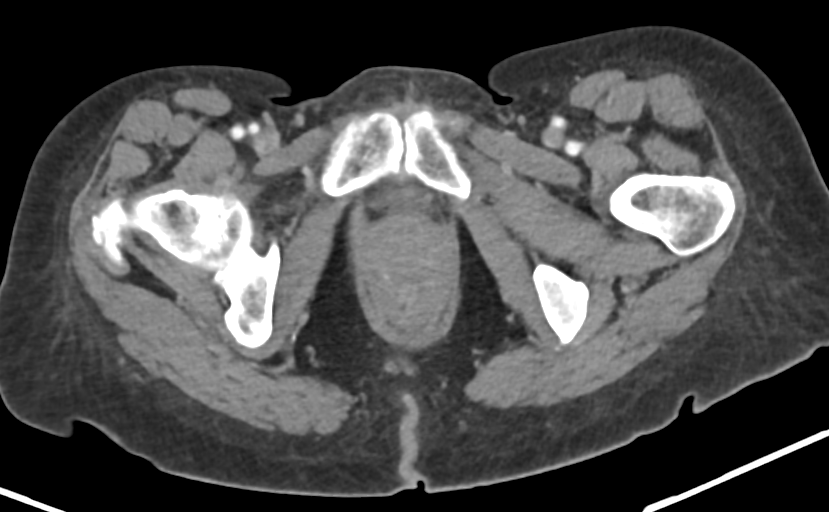
[im 25/55  soft-tissue]
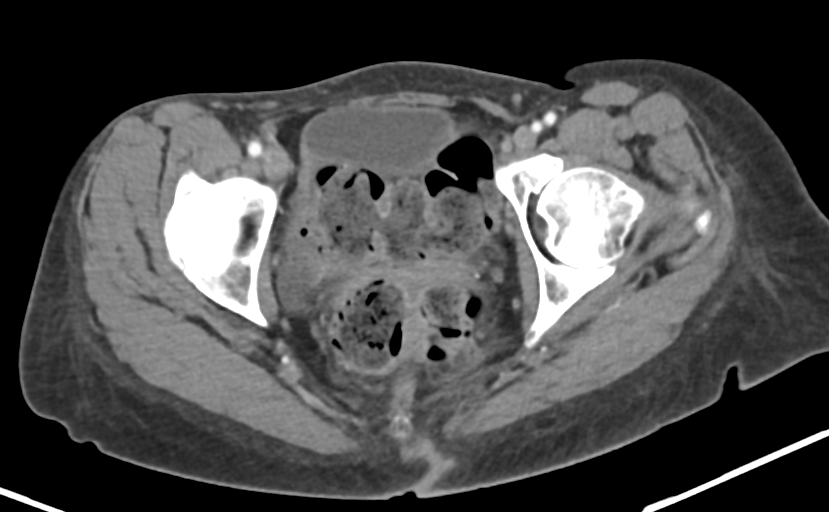
[im 30/55  soft-tissue]
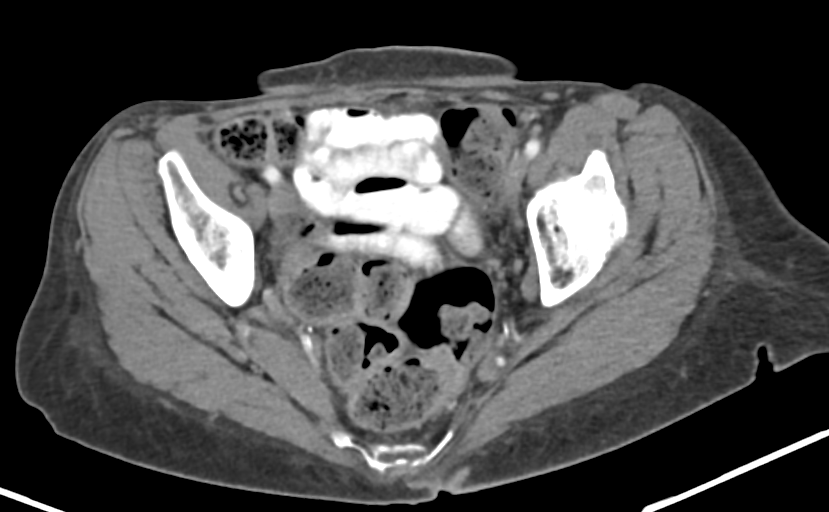
[im 37/55  soft-tissue]
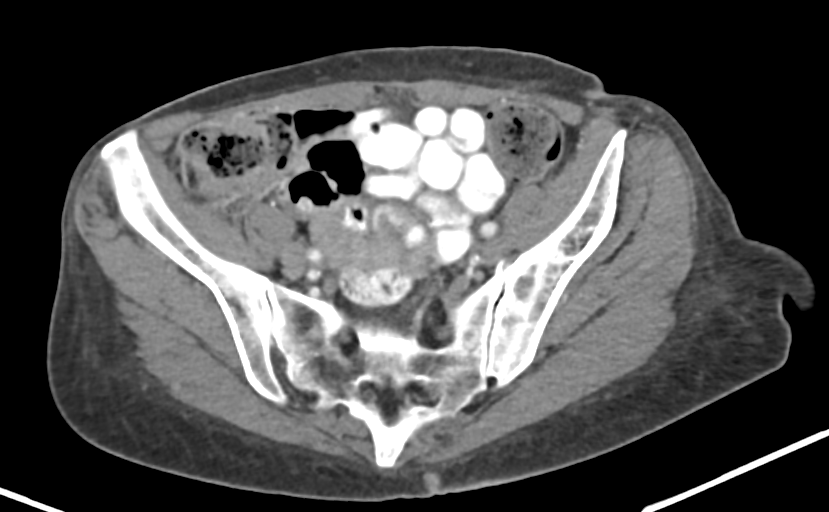
[im 44/55  soft-tissue]
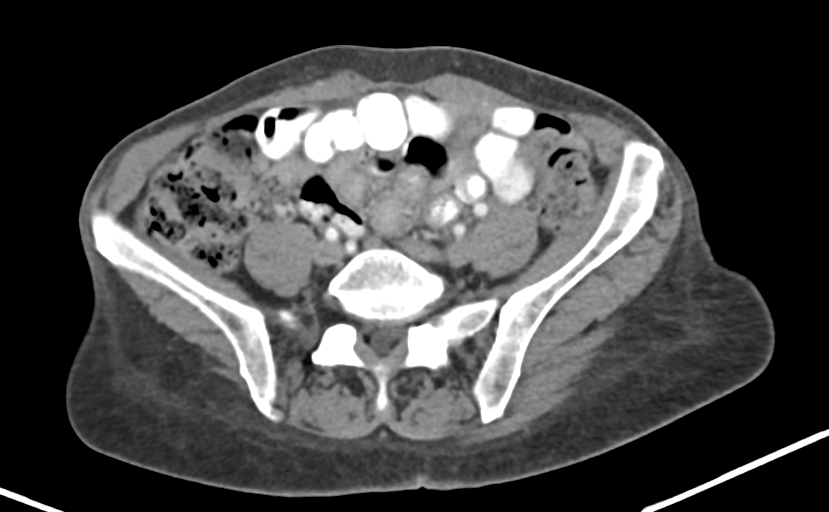
[im 51/55  soft-tissue]
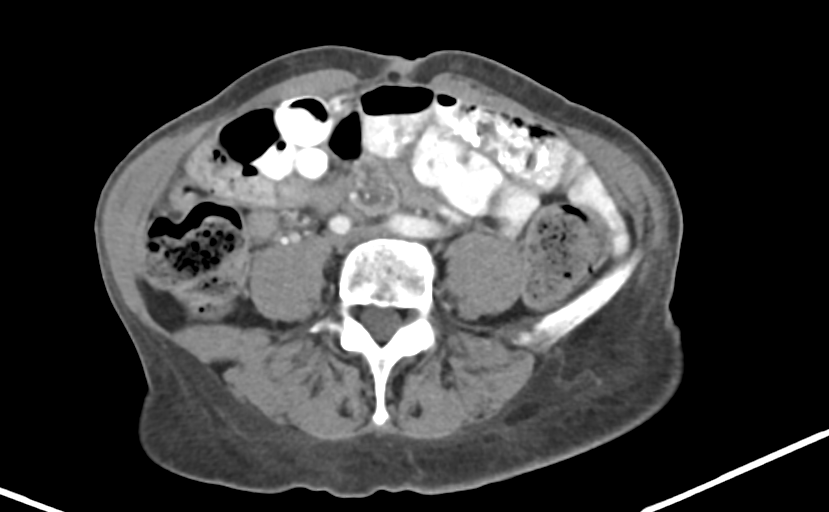

[Series 4: coronal · coronal · 0.54mm/px · 3 of 41 slices shown]
[im 14/41  soft-tissue]
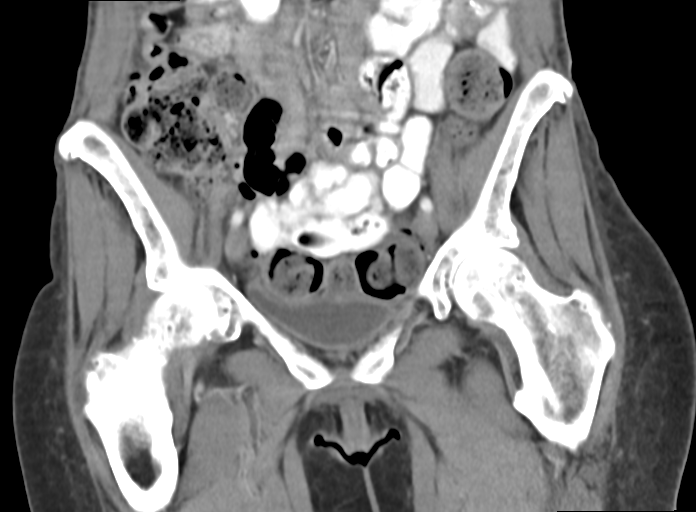
[im 18/41  soft-tissue]
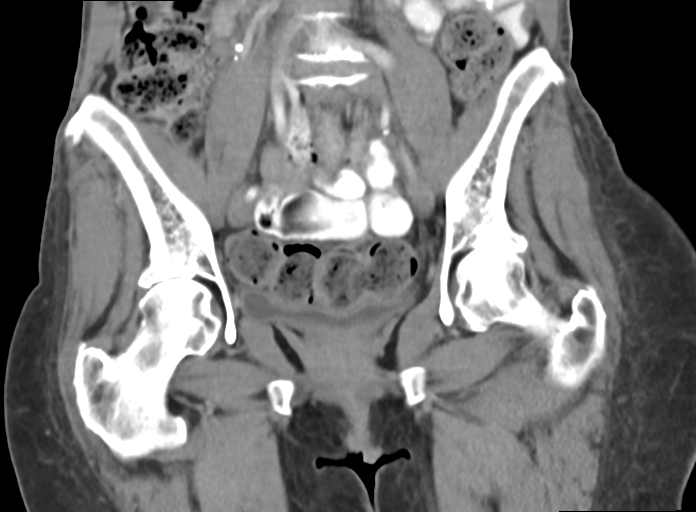
[im 23/41  soft-tissue]
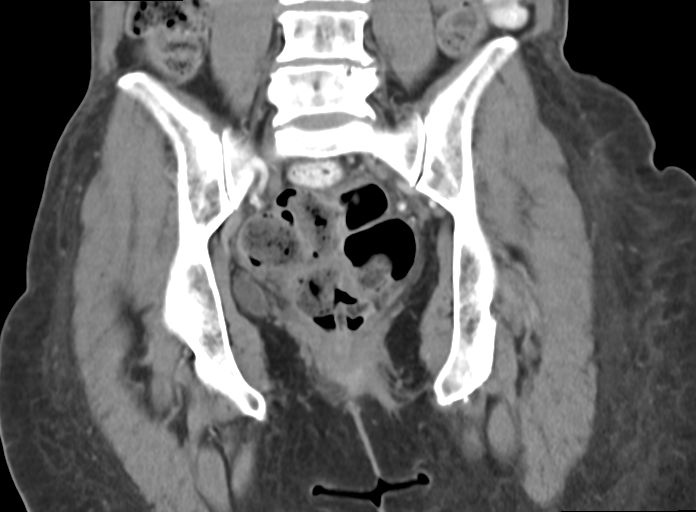

[11 of 46 positions shown; findings below may reference images not displayed]

Serum creatinine is 1.0. Coronal and sagittal reconstructions were obtained. Dose
reduction technique used: Automated exposure control and adjustment of the mA and/or kV according to patient size. CT Studies and Cardiac Nuclear Medicine Studies in last 12-months = 1
Visualized GI tract shows a moderate to large amount of feces throughout the colon. Urinary bladder is mildly distended and unremarkable in appearance. The uterus is not visualized. There is no free
fluid or extraluminal gas identified. No obvious adnexal pathology is appreciated. Musculoskeletal system shows mild degenerative disc disease at L4-5. No adenopathy is appreciated.
IMPRESSION: Constipation pattern.
Total radiation dose to patient is CTDIvol 7.24 mGy and DLP 221.00 mGy-cm.

## 2022-05-08 IMAGING — MR MRI LSPINE WO CONTRAST
5 of 6 series · 25 of 48 positions shown · non-contrast
Comparison: Correlation is made to previous CT of the abdomen and pelvis February 10, 2021 that show 5 lumbar-type vertebrae with mild thoracolumbar levoscoliosis with apex of the curve at approximately
L1.

Images Obtained from [HOSPITAL] Imaging
INDICATION: Low back pain radiating down to both lower extremities (right more than left).
TECHNIQUE: Sagittal and axial multisequence MR images of the lumbar spine were performed without contrast.

[Series 11: iii_aaspine_lspine_mpr_cor · coronal · 1.7mm · 1.67mm/px · 9 of 80 slices shown]
[im 5/80]
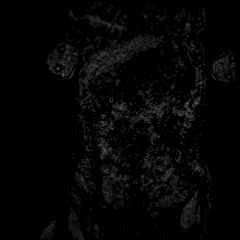
[im 14/80]
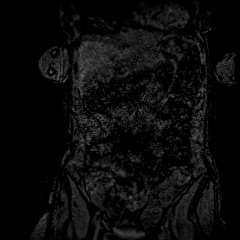
[im 22/80]
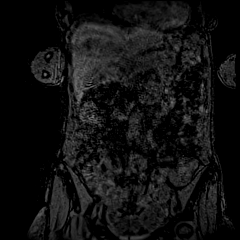
[im 36/80]
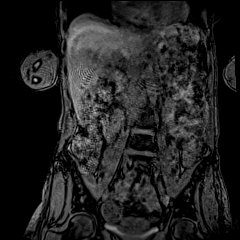
[im 40/80]
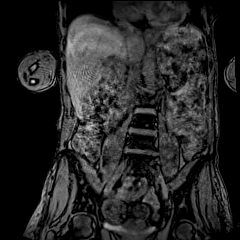
[im 44/80]
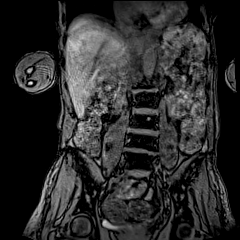
[im 58/80]
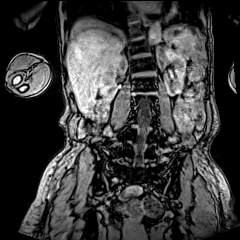
[im 66/80]
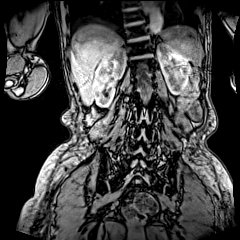
[im 75/80]
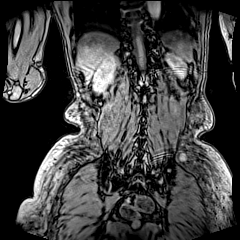

[Series 18: t2_sag · sagittal · 4.0mm · 0.47mm/px · 4 of 15 slices shown]
[im 1/15]
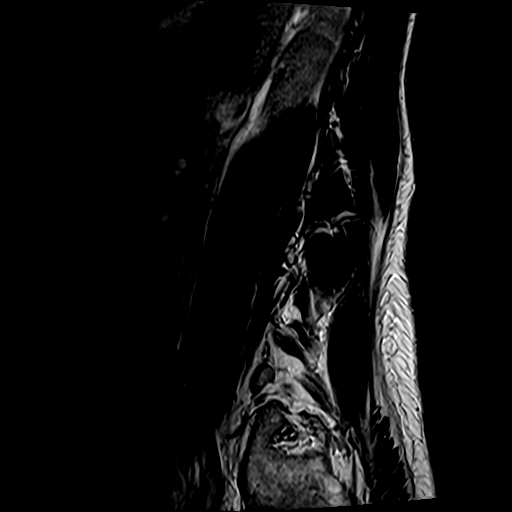
[im 5/15]
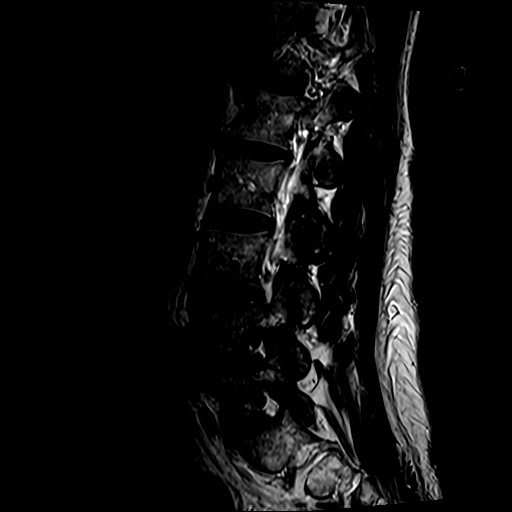
[im 10/15]
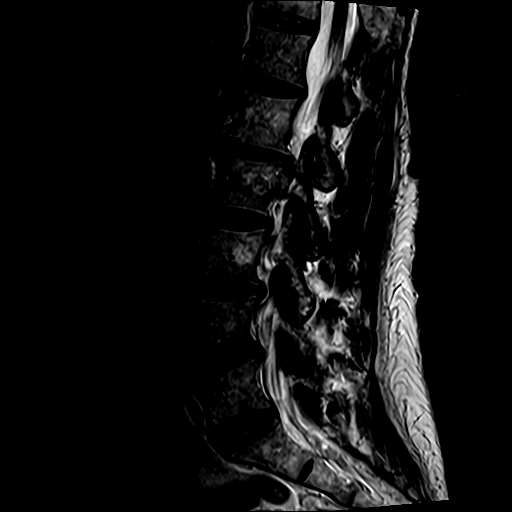
[im 15/15]
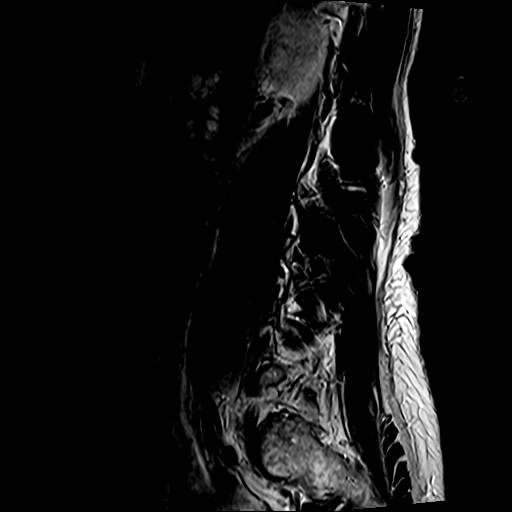

[Series 19: t1_sag · sagittal · 4.0mm · 0.47mm/px · 4 of 15 slices shown]
[im 1/15]
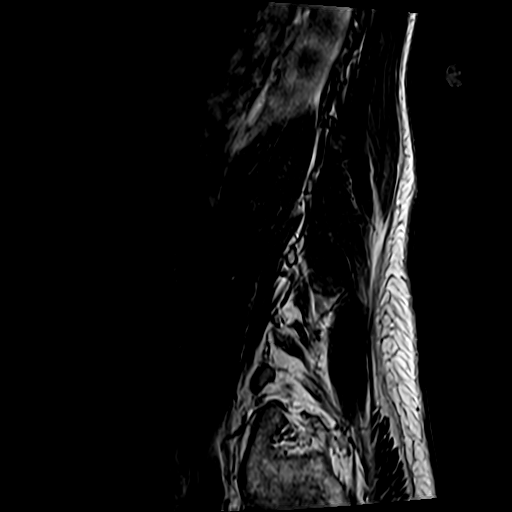
[im 5/15]
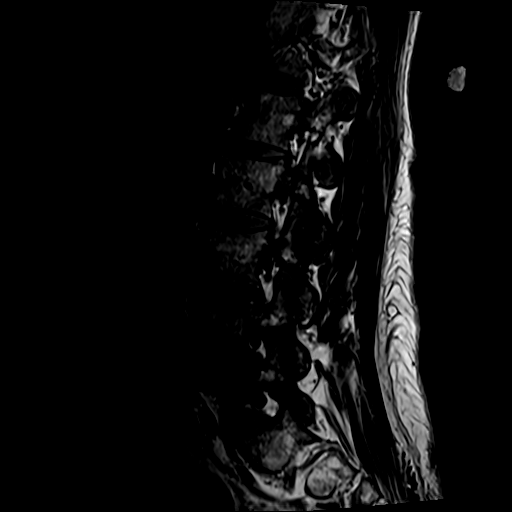
[im 10/15]
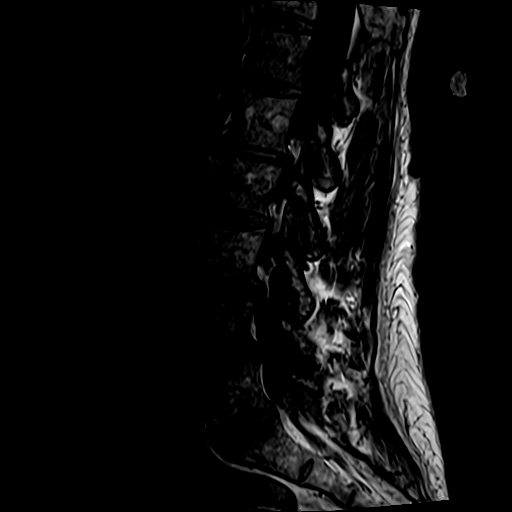
[im 15/15]
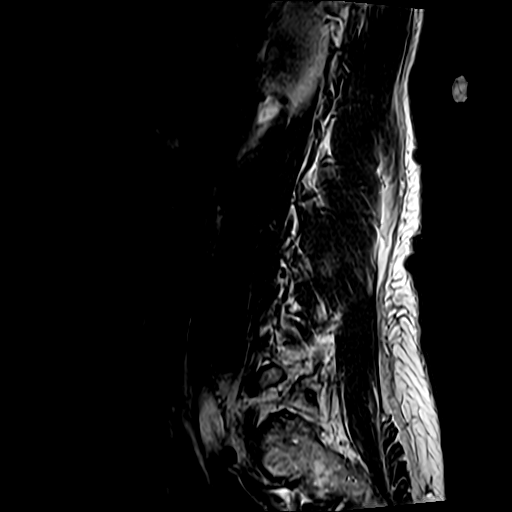

[Series 20: ir_sag · sagittal · 4.0mm · 0.47mm/px · 4 of 15 slices shown]
[im 1/15]
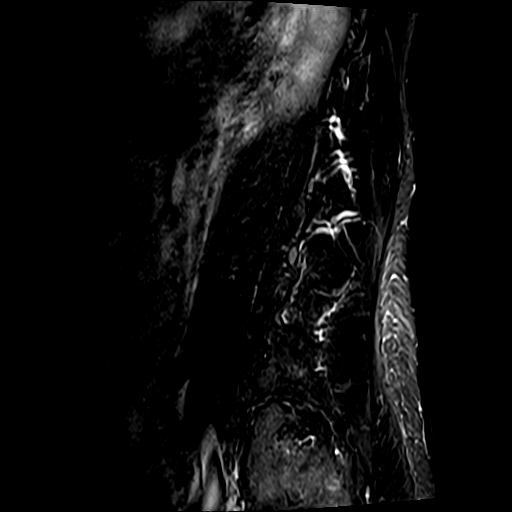
[im 5/15]
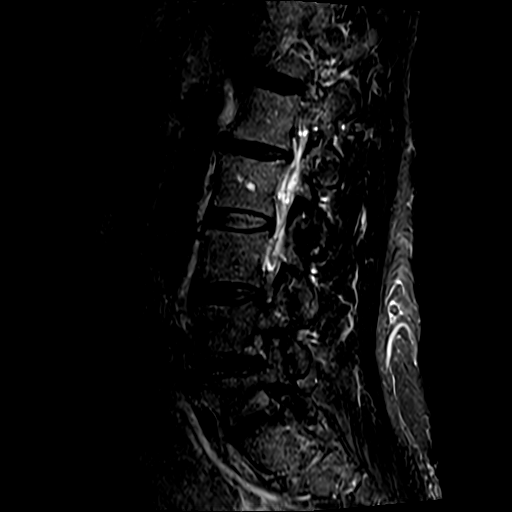
[im 10/15]
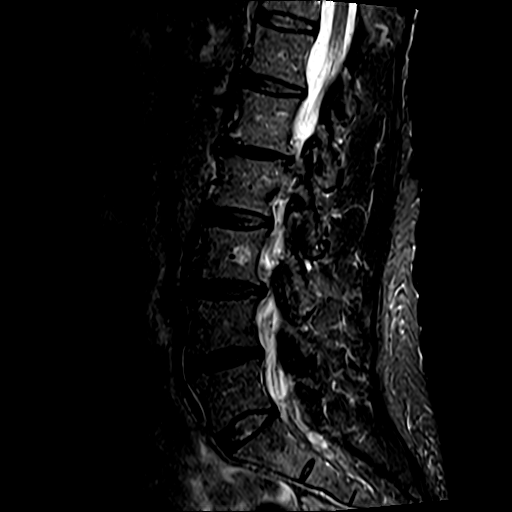
[im 15/15]
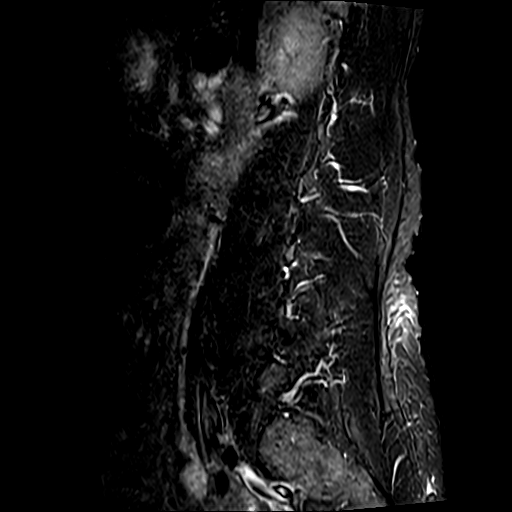

[Series 21: t2_axial · axial · 4.0mm · 0.43mm/px · z∈[-518,-441]mm · 4 of 43 slices shown]
[im 1/43]
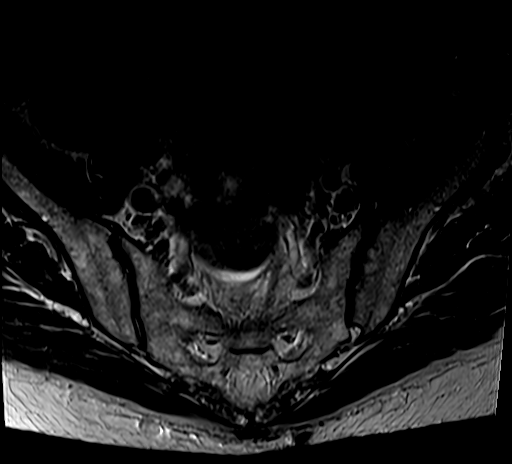
[im 9/43]
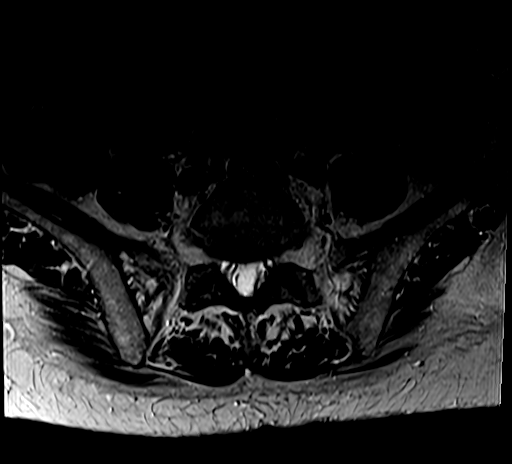
[im 13/43]
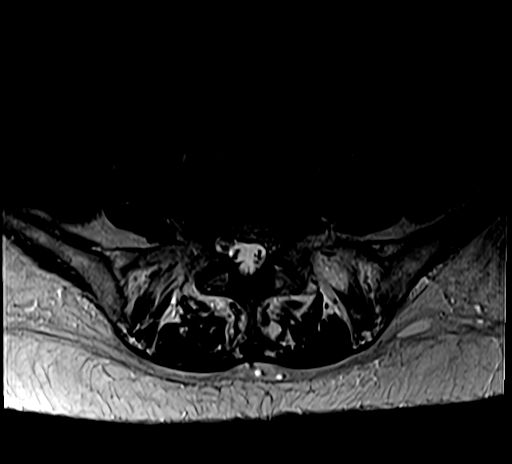
[im 17/43]
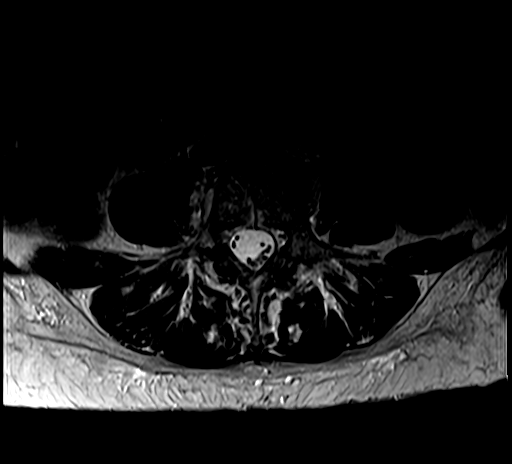

[25 of 48 positions shown; findings below may reference images not displayed]

FINDINGS: From the lateral scout image annotation patient has 7 cervical vertebrae, 12 thoracic vertebrae and 5 lumbar type vertebrae.
There is mild thoracolumbar levoscoliosis with apex of the curve at approximately L1 level. Disc desiccation is seen throughout the visualized lower thoracic and lumbar spine with decrease in disc
space seen throughout the lumbar spine being more prominent at L1-L2 and L4-L5. These findings are in keeping with degenerative disc disease. Signal intensity of the bone marrow and distal neural
cord is normal with normal conus ending at L1 pedicle level. Cauda equina is normal. Spinal canal is of normal caliber.
At T12-L1 there are mild bilateral degenerative changes of the facets with left paracentral disc protrusion that measure approximately 1-2 mm. This produces no significant spinal canal stenosis or
foraminal stenosis.
At L1-L2 there is a disc bulge with bilateral facet arthropathy. This produces mild segmental spinal canal stenosis with moderate right and no significant left foraminal stenosis.
At L2-L3 there is a disc bulge with moderate bilateral facet arthropathy and mild hypertrophy of the ligamentum flavum. This produces mild segmental spinal canal stenosis without significant
foraminal stenosis.
At L3-L4 there is a broad disc bulge with moderate bilateral facet arthropathy with hypertrophy of the ligamentum flavum. This produces moderate segmental spinal canal stenosis with moderate right
foraminal stenosis and no significant left foraminal stenosis.
At L4-L5 there is a broad disc bulge with moderate bilateral facet arthropathy with hypertrophy of the ligamentum flavum. This produces moderate segmental spinal canal stenosis with moderate left and
mild right foraminal stenosis.
At L5-S1 there is a disc bulge with mild bilateral facet arthropathy producing no significant spinal canal or foraminal stenosis.
Incidentally there is a focal area of dark signal intensity in the right lower renal collecting system measuring approximately 6.3 mm in keeping with a stone which was present in the previous CT.
IMPRESSION: 1.  Mild thoracolumbar levoscoliosis with apex of the curve at approximately L1.
2.  Degenerative disc disease with degenerative changes of the facets producing spinal canal and foraminal stenoses as described.

## 2022-08-15 IMAGING — CT CT PELVIS W CONTRAST
2 of 3 series · 10 of 46 positions shown, 11 images · IV contrast (agent unspecified)
Comparison: Previous CT pelvis exam 05/07/2022 and previous CT abdomen and pelvis 06/12/2021

Images Obtained from [HOSPITAL] Imaging
HISTORY: Pelvic and perineal pain
TECHNIQUE: Axial CT images were obtained through the pelvis after administration of intravenous contrast. Coronal and sagittal reconstructions were generated from an original data set.
Dose reduction technique used: Automated exposure control and adjustment of the mA and/or kV according to patient size. CT Studies and Cardiac Nuclear Medicine Studies in last 12-months = 1
CONTRAST: 100 mL of Psovue-VQQ.
I-STAT Creatinine: 1.0 mg/dL.

[Series 2: soft tissue · axial · 0.44mm/px · z∈[+1108,+1318]mm · 7 of 54 slices shown, 8 images]
[im 6/54  soft-tissue]
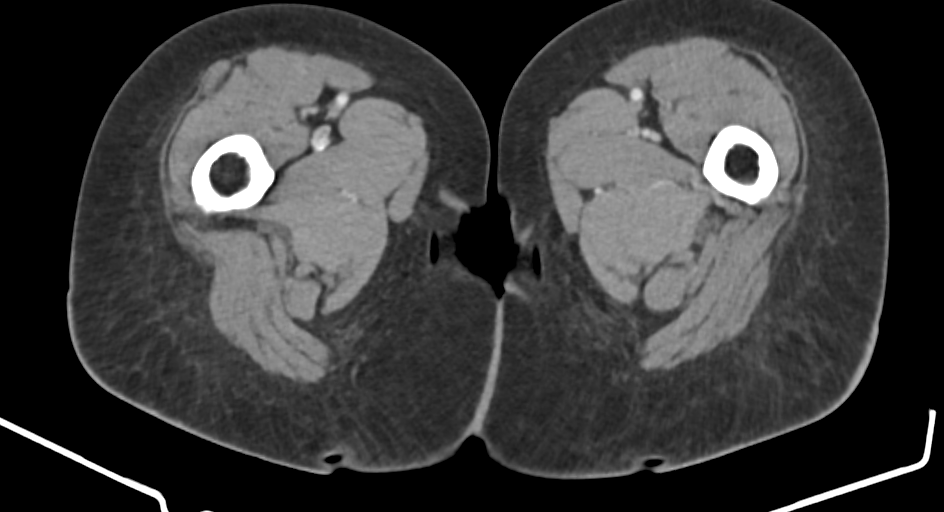
[im 6/54  bone]
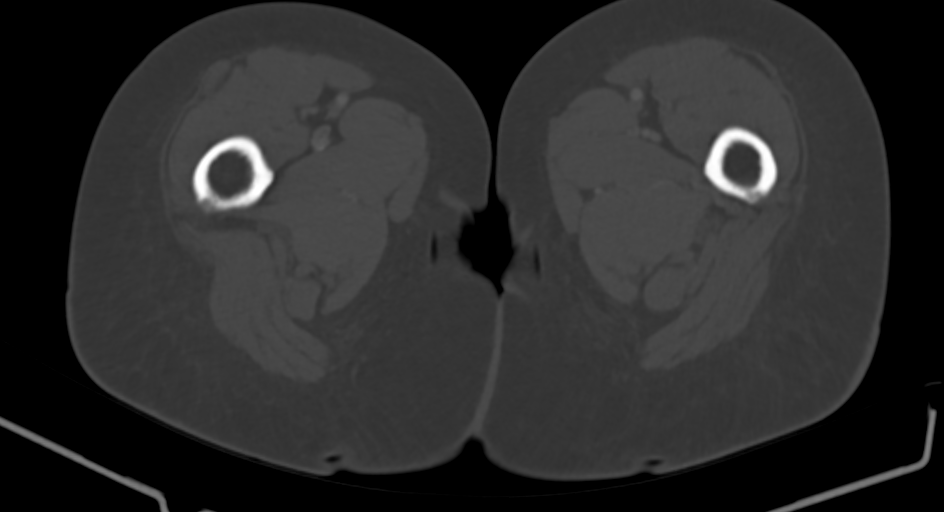
[im 12/54  soft-tissue]
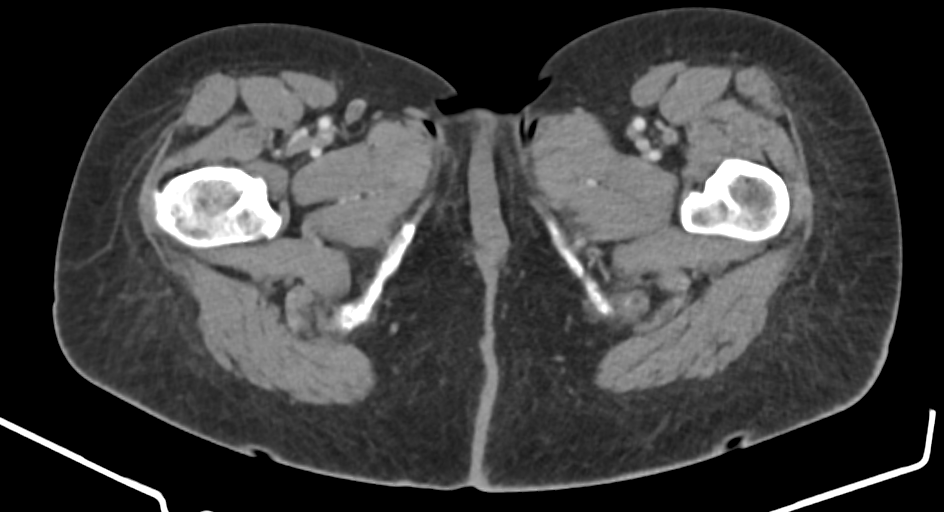
[im 19/54  soft-tissue]
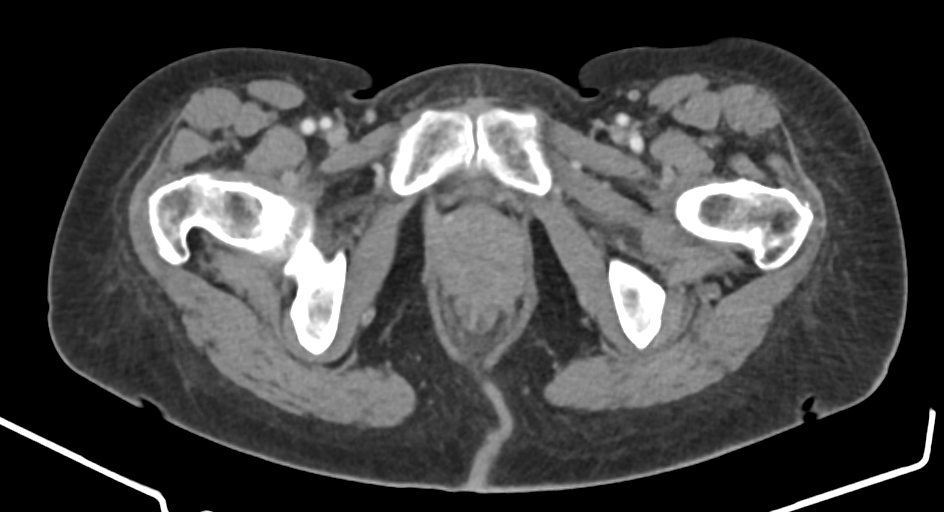
[im 28/54  soft-tissue]
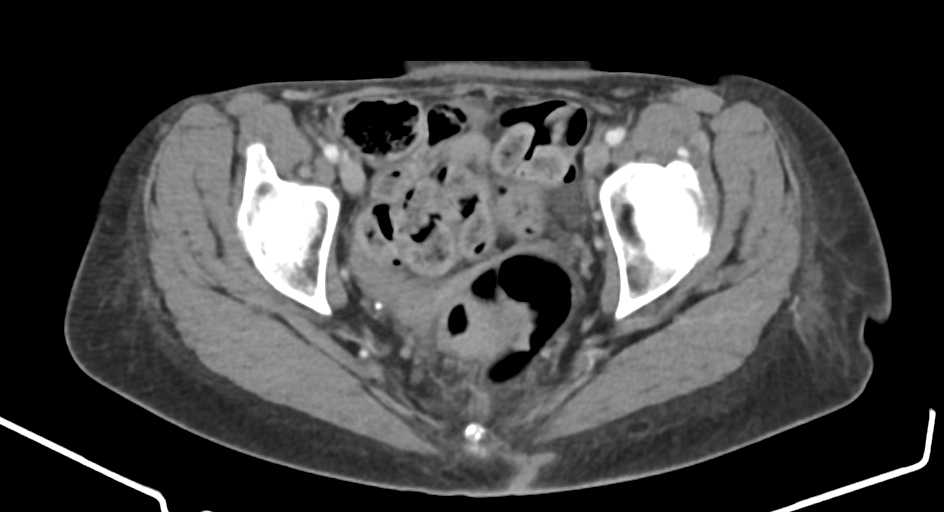
[im 35/54  soft-tissue]
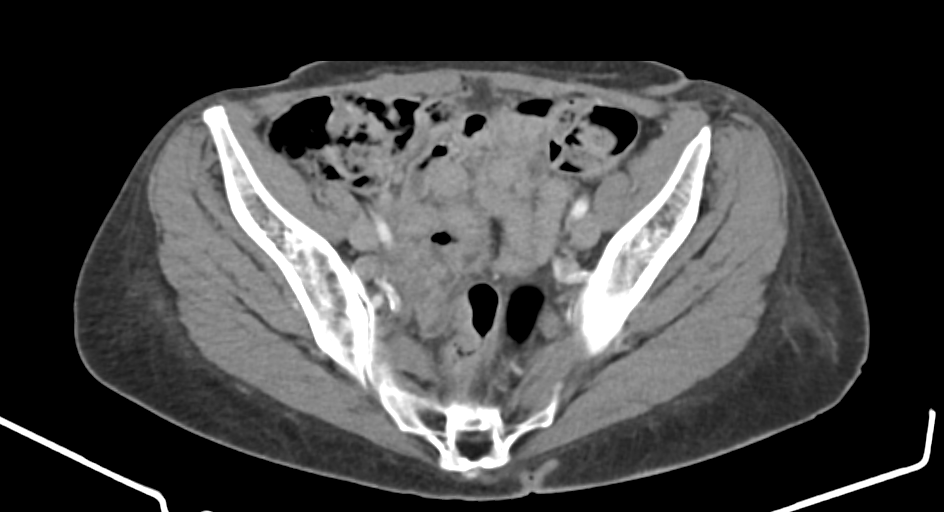
[im 42/54  soft-tissue]
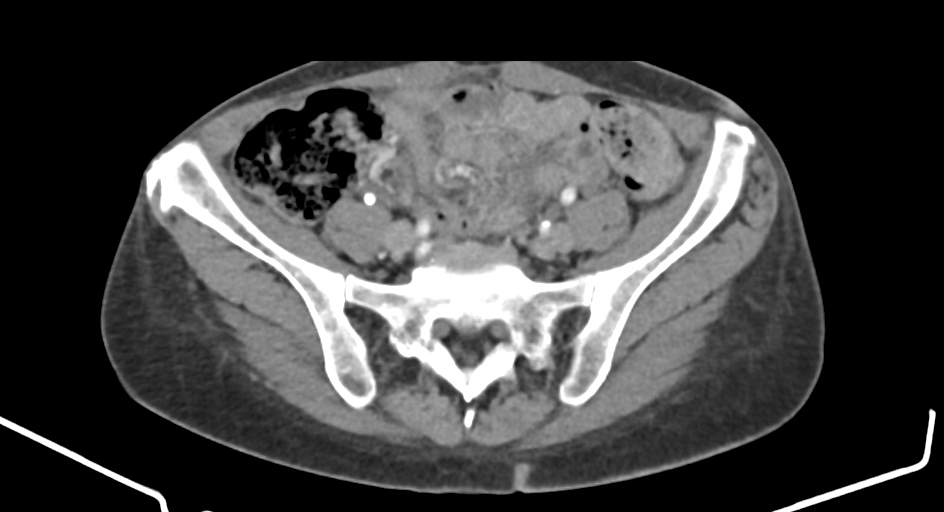
[im 48/54  soft-tissue]
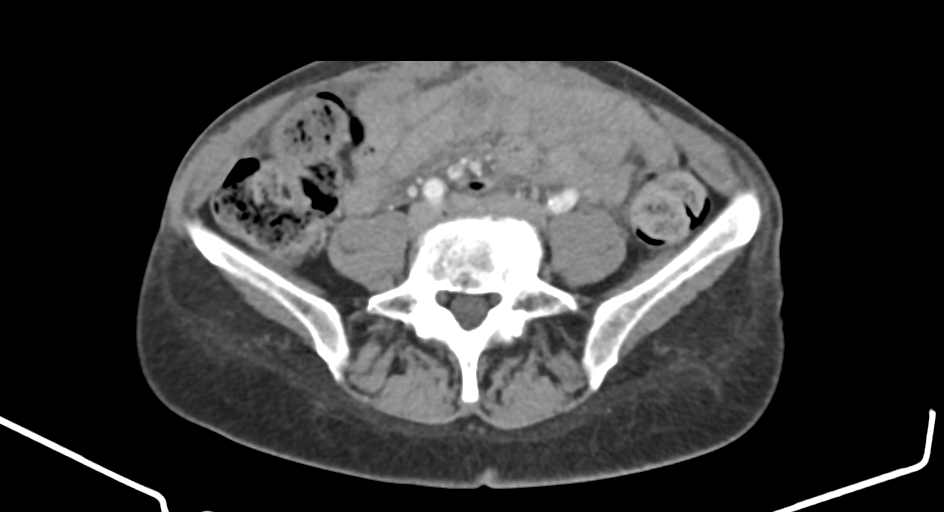

[Series 4: coronal · coronal · 0.53mm/px · 3 of 45 slices shown]
[im 15/45  soft-tissue]
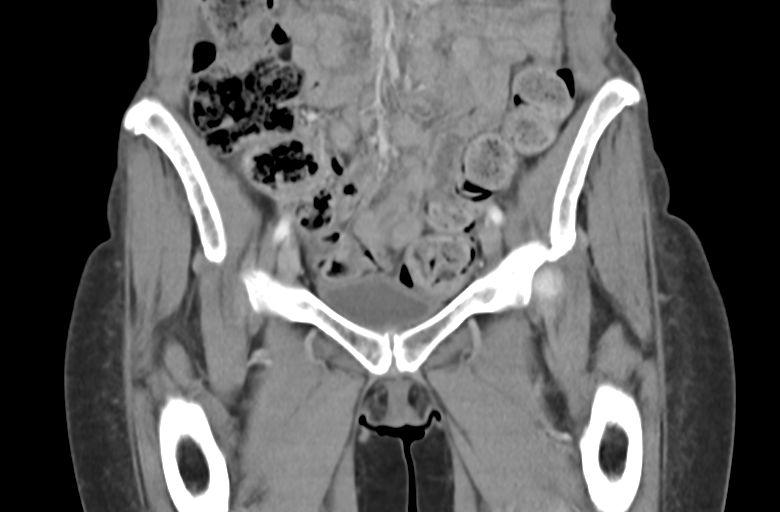
[im 20/45  soft-tissue]
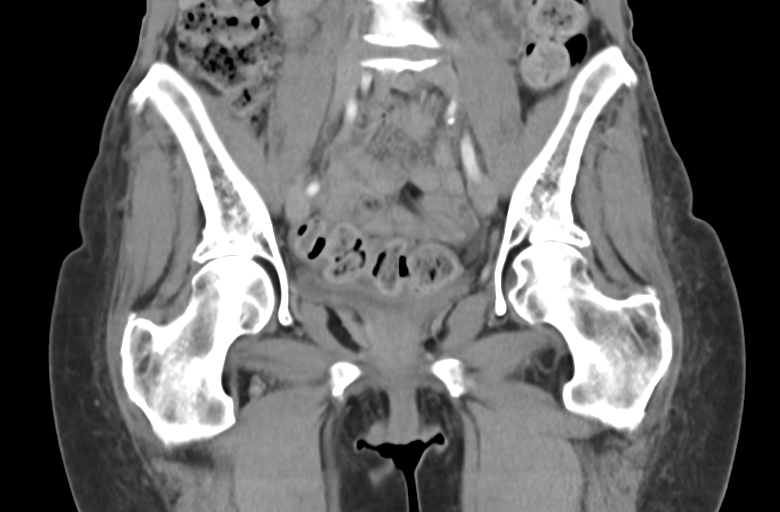
[im 25/45  soft-tissue]
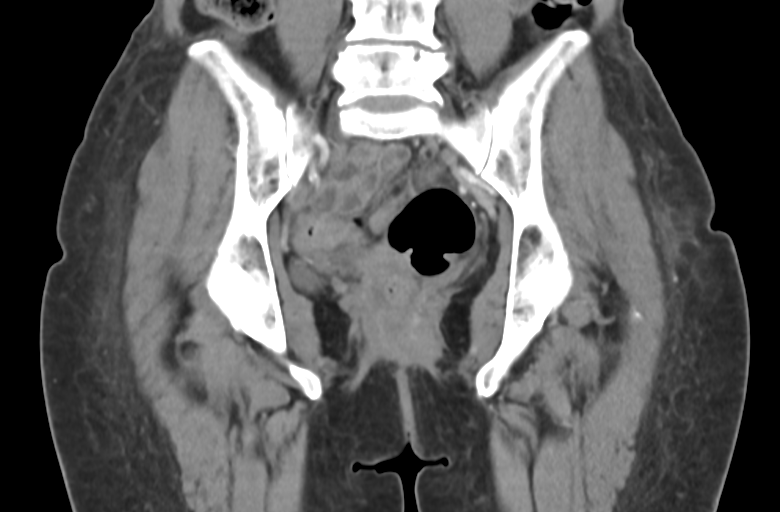

[10 of 46 positions shown; findings below may reference images not displayed]

FINDINGS: The patient is status post hysterectomy. The bladder is unremarkable. No free fluid is seen. The visualized bowel loops do have a normal caliber. There does appear to be copious stool in the
visualized colon that may indicate constipation. Correlate clinically. No adenopathy is seen. Moderate degenerative disc type changes are seen at L4-L5.
IMPRESSION: 1.  No acute findings.
2.  Copious stool in the colon may indicate constipation. Correlate clinically.
3.  Status post hysterectomy.
Total radiation dose to patient is CTDIvol 7.64 mGy and DLP 230.00 mGy-cm.

## 2023-01-02 IMAGING — CT CT FOOT RT W CONTRAST
3 of 5 series · 9 of 33 positions shown, 10 images · non-contrast
Comparison: Right foot radiographs, 05/11/2021.

Images Obtained from [HOSPITAL] Imaging
INDICATION: Other specified joint disorders, unspecified joint
TECHNIQUE: Multiple axial CT images of the right foot were performed after the intravenous administration of 100 mL of Lsovue-122 contrast. I-STAT creatinine 1.0. Coronal and sagittal reformatted
images were obtained and interpreted. Dose reduction technique used: Automated exposure control and adjustment of the mA and/or kV according to patient size. CT Studies and Cardiac Nuclear Medicine
Studies in last 12-months = 2  Total radiation dose to patient is CTDIvol 4.91 mGy and DLP 72.20 mGy-cm.

[Series 9: soft tissue · axial · 0.21mm/px · z∈[+674,+674]mm · 1 of 40 slices shown, 2 images]
[im 22/40  soft-tissue]
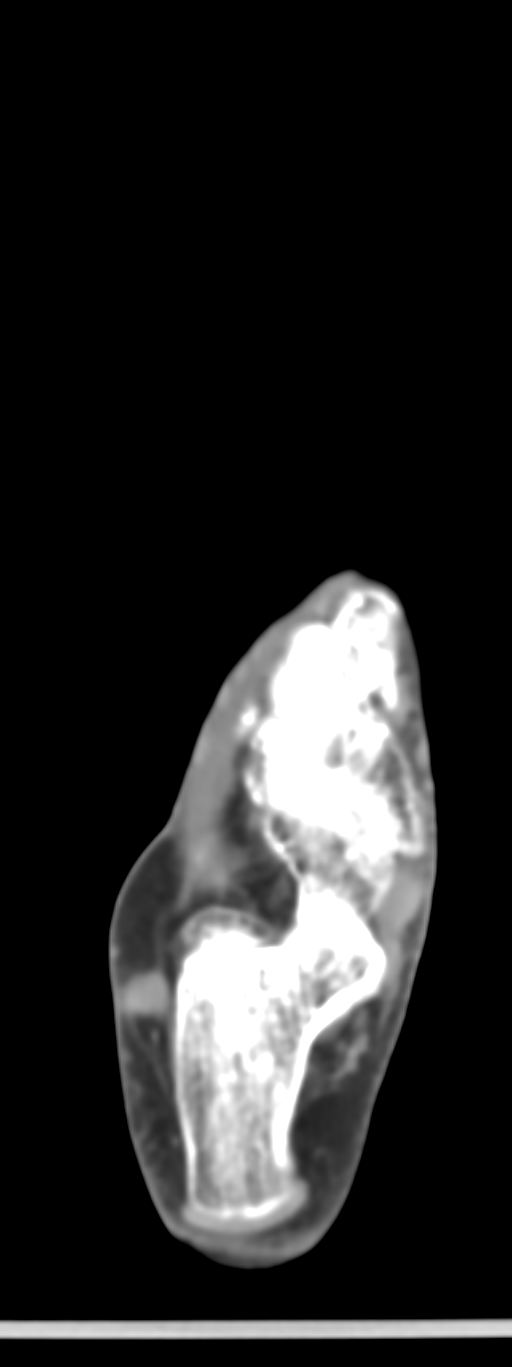
[im 22/40  bone]
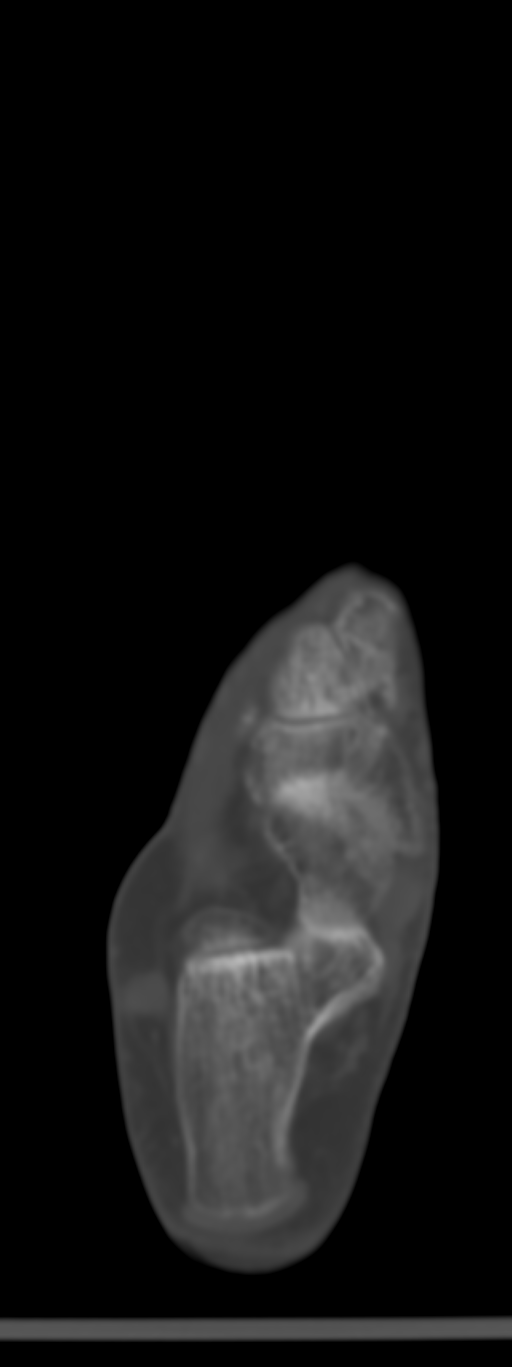

[Series 13: coronal · coronal · 0.21mm/px · 3 of 353 slices shown]
[im 91/353  bone]
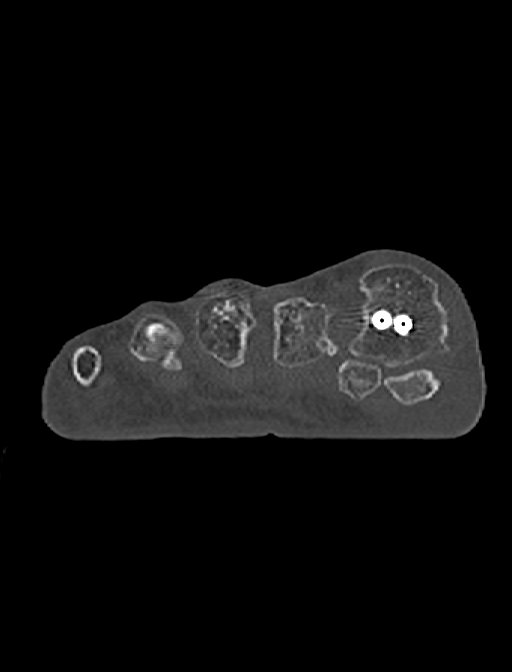
[im 148/353  bone]
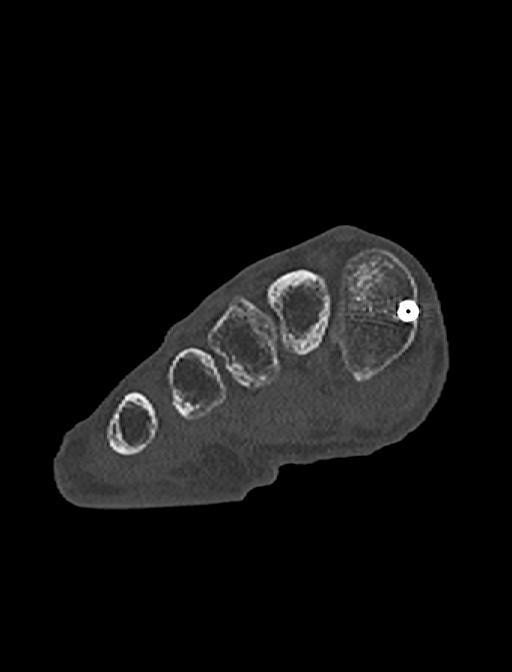
[im 205/353  bone]
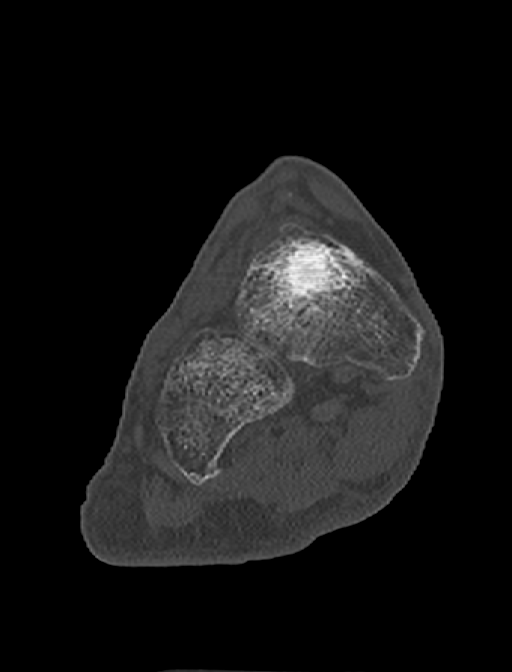

[Series 15: sagittal · sagittal · 0.28mm/px · 5 of 116 slices shown]
[im 29/116  bone]
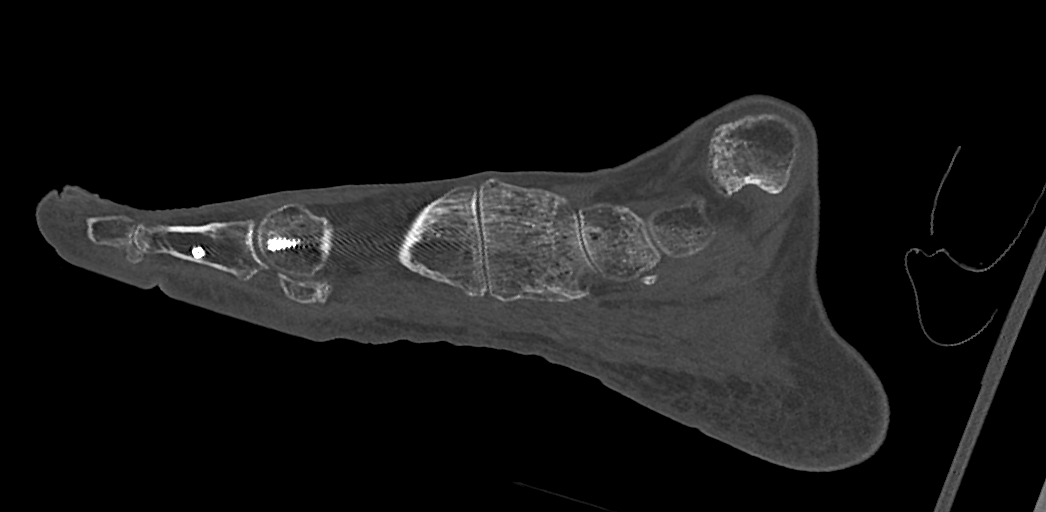
[im 44/116  bone]
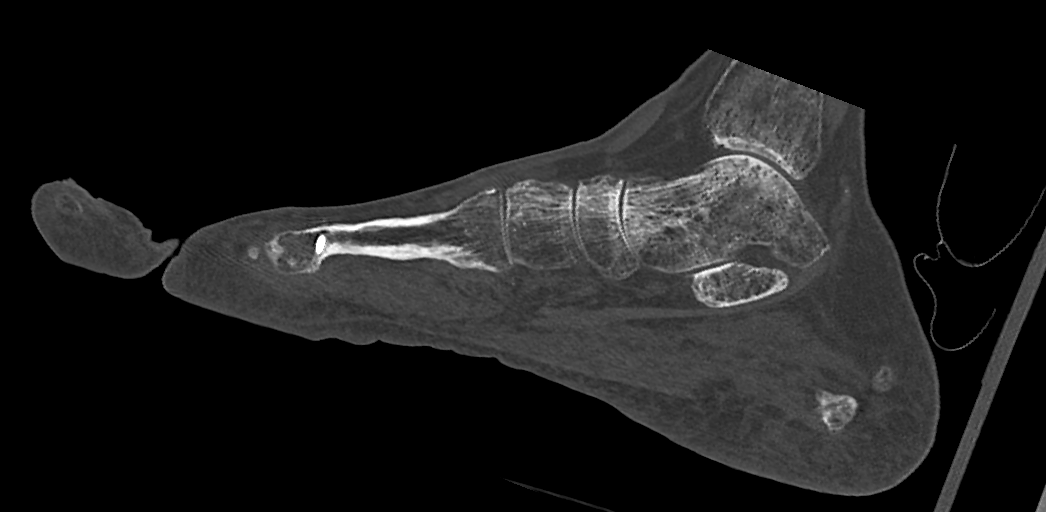
[im 58/116  bone]
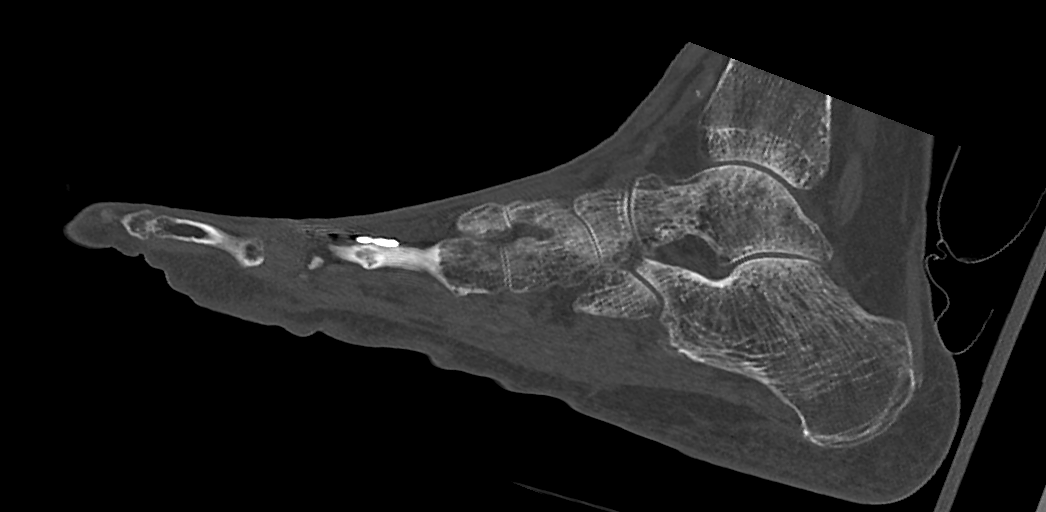
[im 72/116  bone]
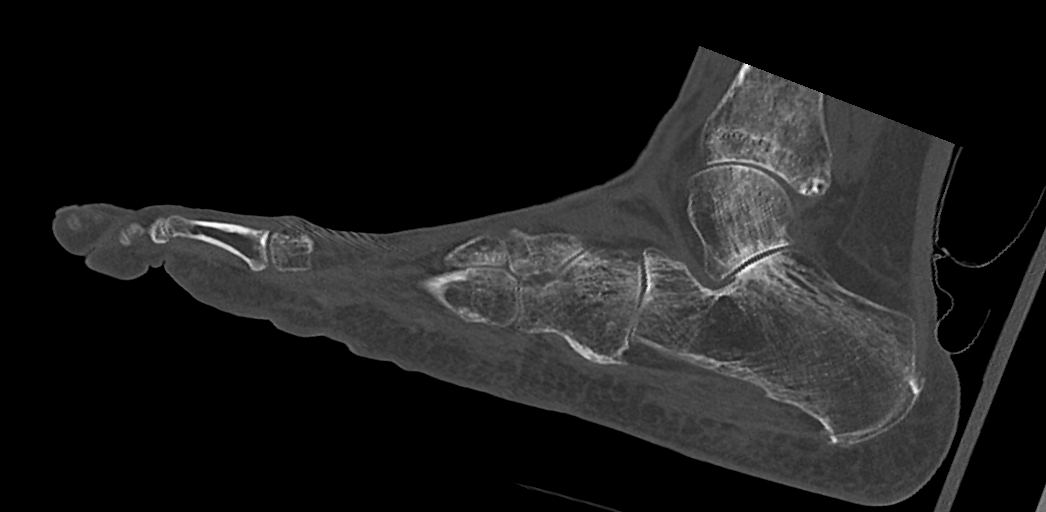
[im 87/116  bone]
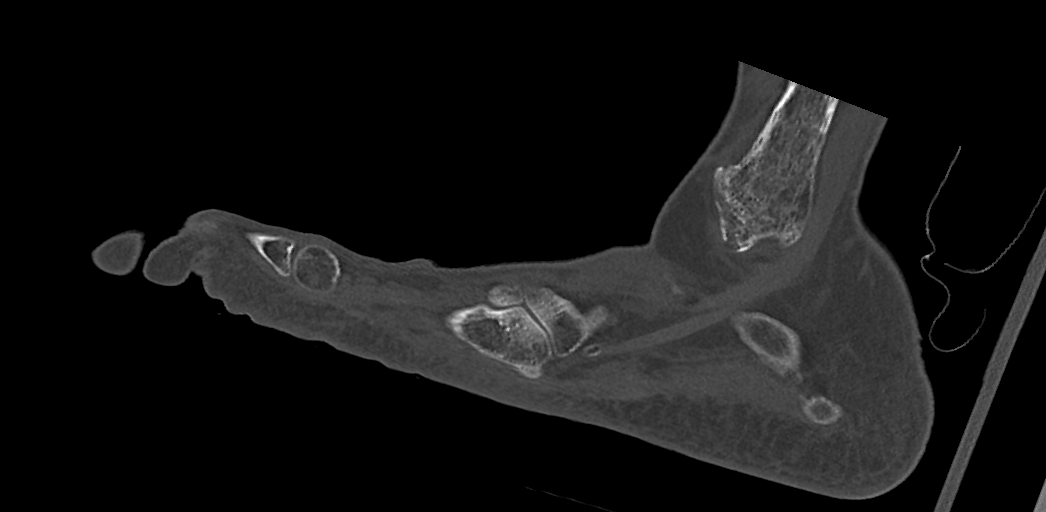

[9 of 33 positions shown; findings below may reference images not displayed]

FINDINGS: A marker is placed along the plantar aspect of the first MTP joint, denoting the patient directed area of concern. Nonspecific soft tissue attenuation in the subcutaneous fat underlying the plantar
medial aspect of the first metatarsal head measuring 31 x 24 mm. No peripherally enhancing fluid collection.
Osseous demineralization. No acute fracture. Postoperative changes of the first, second and third metatarsals with old healed deformities. Old healed deformity of the fourth and fifth metatarsal.
Postoperative changes of the first proximal phalanx. No hardware fracture or perihardware lucency. Severe first MTP joint osteoarthritis. Moderate to severe third MTP joint osteoarthritis. Mild
second and third MTP joint osteoarthritis. Alignment of the TMT joints are maintained. Small plantar and posterior calcaneal enthesophytes. Scattered vascular calcifications are noted about the
ankle. Mild naviculocuneiform joint osteoarthritis.
The visualized ankle tendons are grossly intact.
IMPRESSION: 1.  Nonspecific soft tissue attenuation in the subcutaneous fat underlying the plantar medial aspect of the first metatarsal head, with a broad range of differential considerations including
scar/fibrosis, adventitial bursa formation or cellulitis. No peripherally enhancing fluid collection.
2.  Old healed fracture deformities of the first through fifth metatarsals. Prior internal fixation of the first through third metatarsals without evidence of perihardware lucency or hardware
3.  Severe first MTP joint osteoarthritis.
4.  Moderate to severe third MTP joint osteoarthritis.

## 2023-06-20 IMAGING — CR L-SPINE 4 VWS MIN
1 series · 5 of 5 positions shown · non-contrast
Comparison: Lumbar spine MRI 05/08/2022

Images Obtained from [HOSPITAL] Imaging
Lumbar spine radiographs, 5 views
INDICATION: Pain, Breakdown of implanted electronic stimulator

[Series 1: t lumbar spine ap · 0.15mm/px · 5 of 5 slices shown]
[im 1/5]
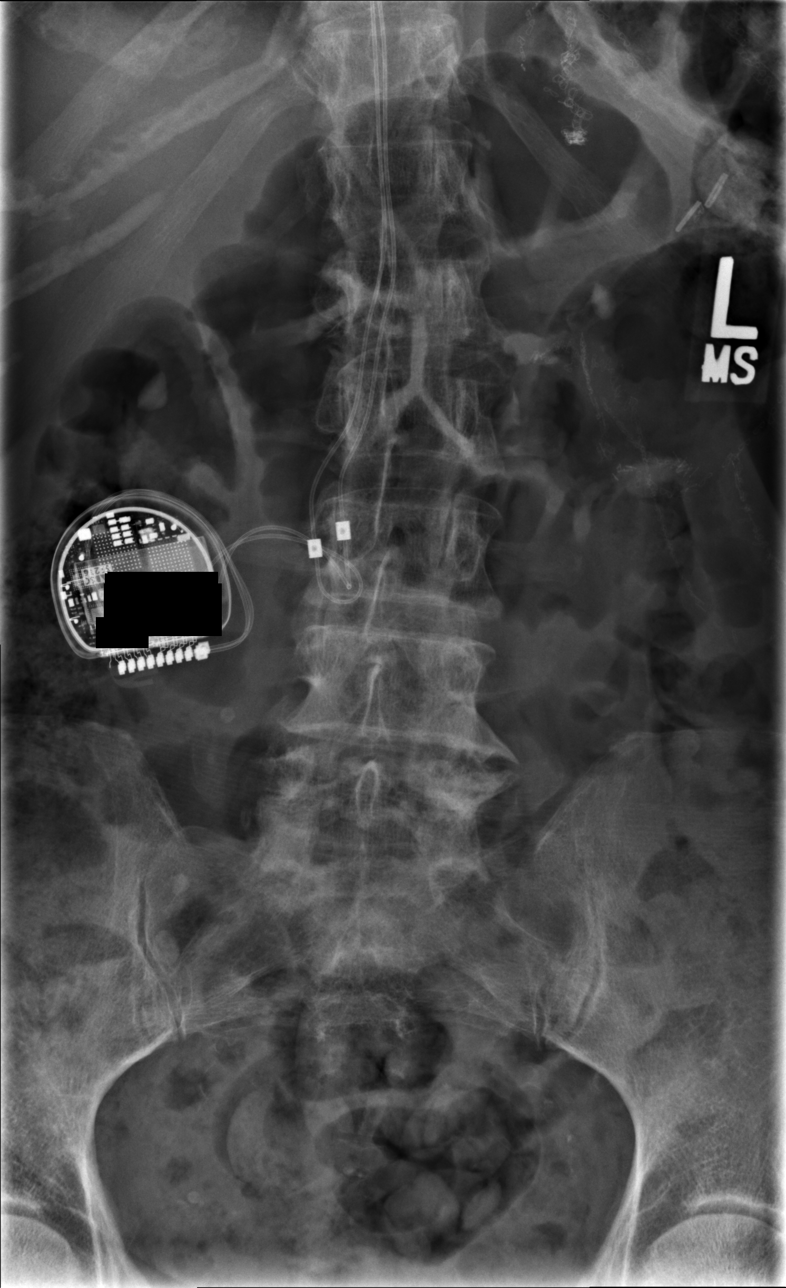
[im 2/5]
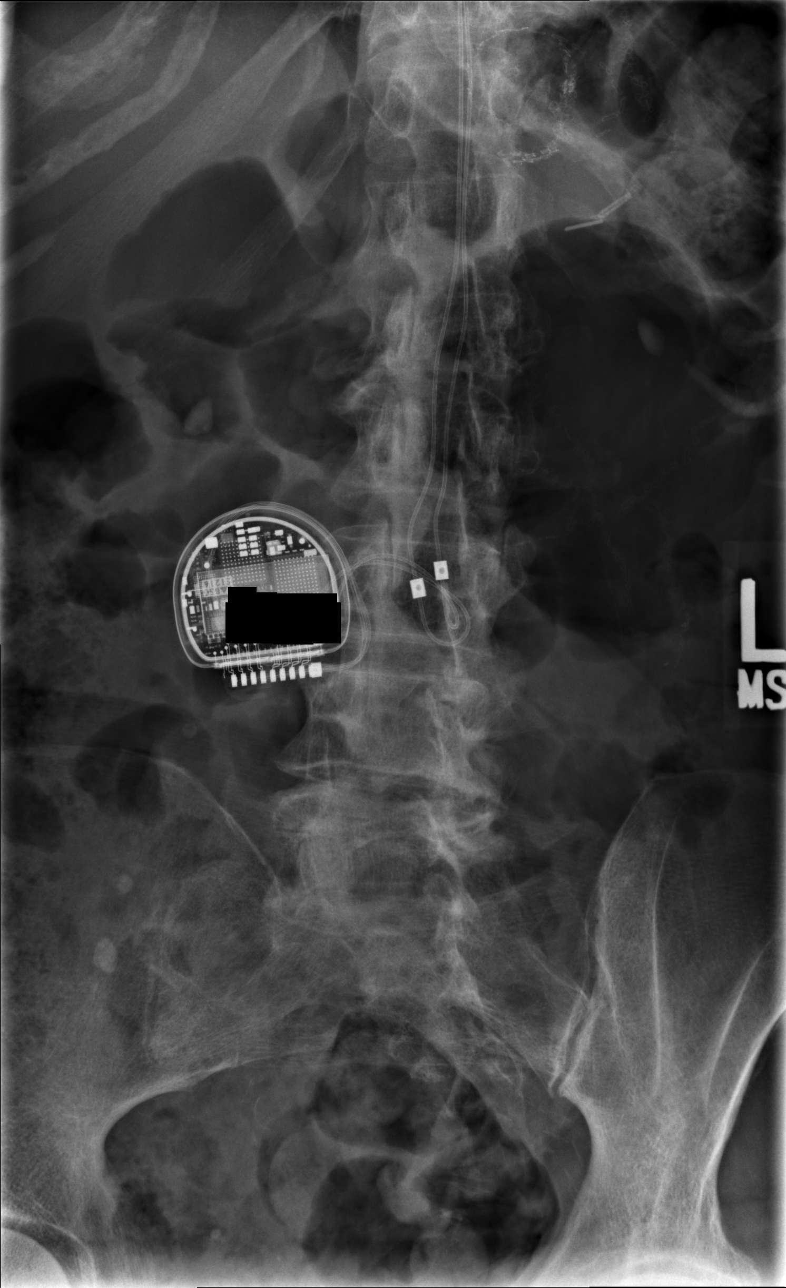
[im 3/5]
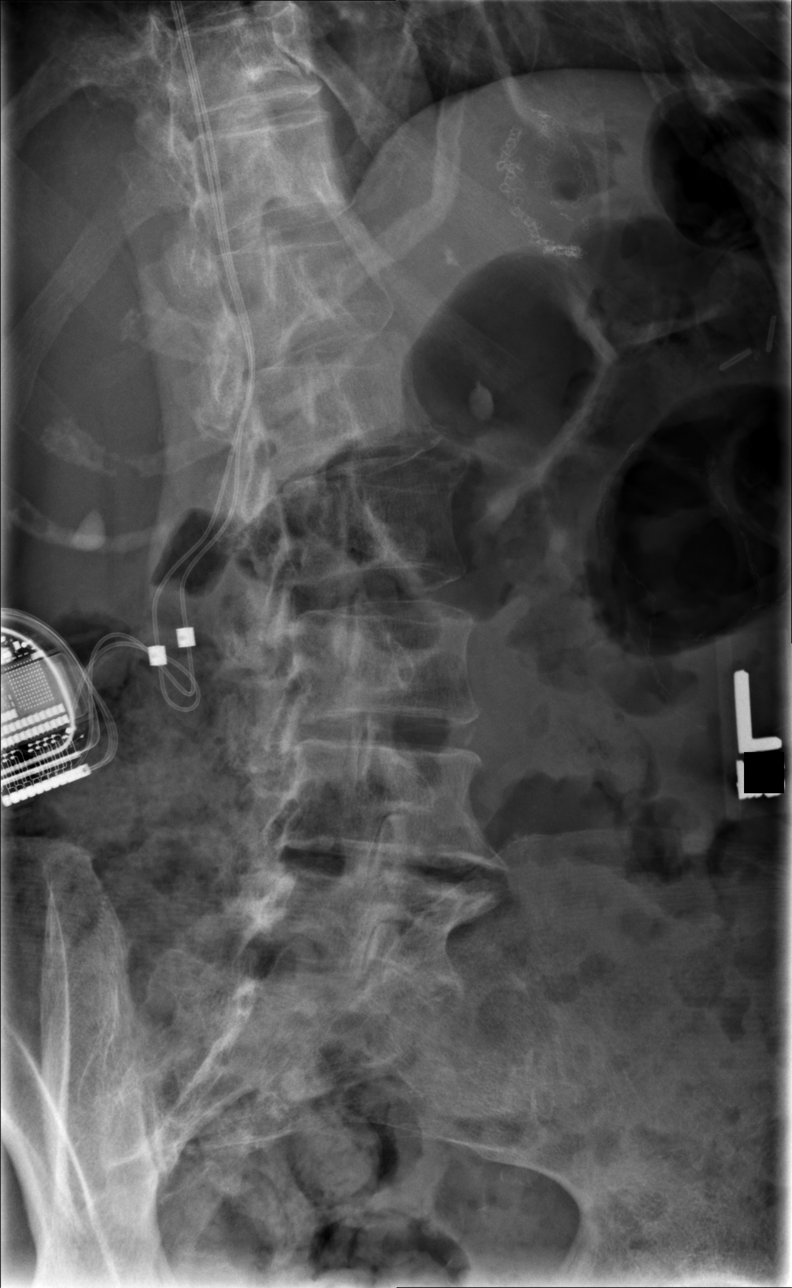
[im 4/5]
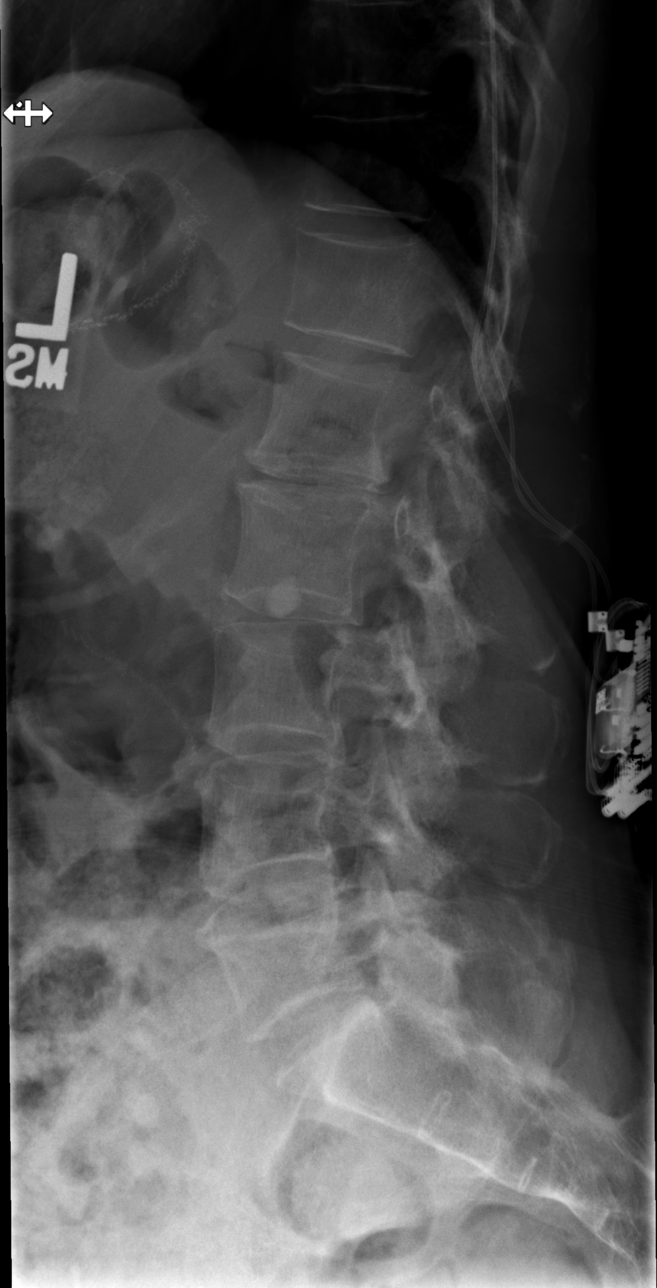
[im 5/5]
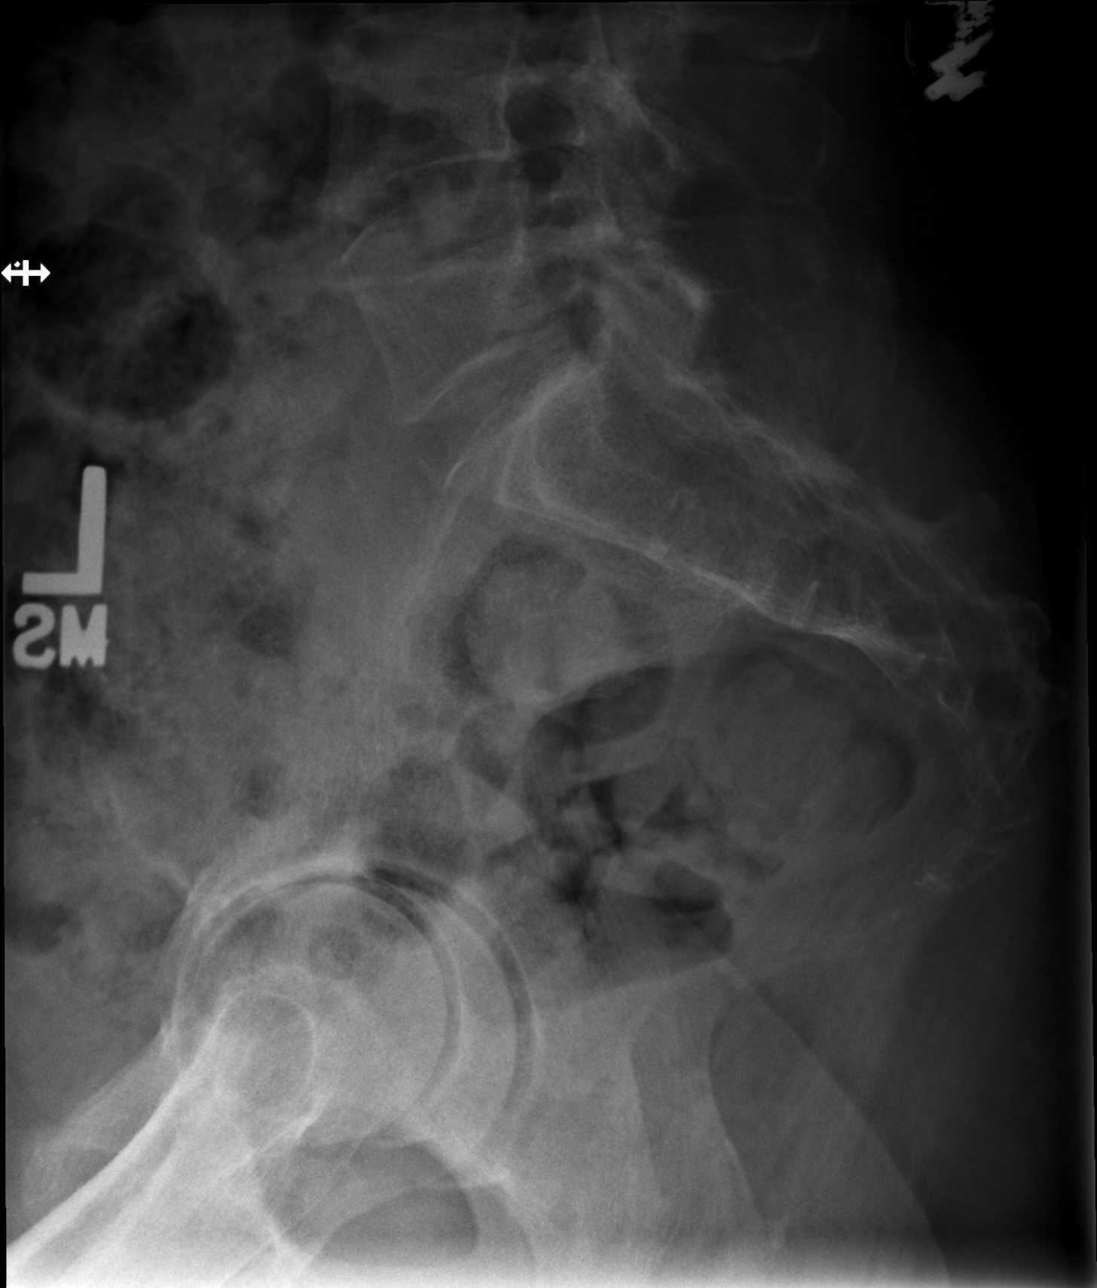

[5 of 5 positions shown; findings below may reference images not displayed]

FINDINGS: Osteopenia. No acute fracture no pars defect. Mild/moderate multilevel facet arthrosis. Mild-moderate multilevel degenerative disc disease. Nerve similar device present. Mild lumbar levoscoliosis.
IMPRESSION: 1.  Mild-moderate multilevel lumbar spine degenerative changes.
2.  Mild lumbar levoscoliosis.
